# Patient Record
Sex: Female | Born: 1990 | Race: Black or African American | Hispanic: No | Marital: Single
Health system: Midwestern US, Community
[De-identification: ages and names within clinical notes are randomized; demographics above are authoritative.]

## PROBLEM LIST (undated history)

## (undated) ENCOUNTER — Emergency Department (HOSPITAL_COMMUNITY): Admission: EM | Payer: No Typology Code available for payment source | Source: Home / Self Care

## (undated) DIAGNOSIS — A599 Trichomoniasis, unspecified: Secondary | ICD-10-CM

## (undated) DIAGNOSIS — A749 Chlamydial infection, unspecified: Secondary | ICD-10-CM

## (undated) DIAGNOSIS — J302 Other seasonal allergic rhinitis: Secondary | ICD-10-CM

## (undated) HISTORY — PX: NO PAST SURGERIES: SHX2092

---
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
## 2009-11-12 ENCOUNTER — Emergency Department (HOSPITAL_COMMUNITY): Admission: EM | Admit: 2009-11-12 | Discharge: 2009-11-12 | Payer: Self-pay | Admitting: Emergency Medicine

## 2010-06-04 ENCOUNTER — Emergency Department (HOSPITAL_COMMUNITY): Admission: EM | Admit: 2010-06-04 | Discharge: 2010-06-04 | Payer: Self-pay | Admitting: Emergency Medicine

## 2010-11-06 LAB — GC/CHLAMYDIA PROBE AMP, GENITAL
Chlamydia, DNA Probe: NEGATIVE
GC Probe Amp, Genital: NEGATIVE

## 2010-11-06 LAB — DIFFERENTIAL
Basophils Absolute: 0 10*3/uL (ref 0.0–0.1)
Basophils Relative: 0 % (ref 0–1)
Eosinophils Relative: 2 % (ref 0–5)
Lymphs Abs: 2 10*3/uL (ref 0.7–4.0)
Monocytes Absolute: 0.4 10*3/uL (ref 0.1–1.0)
Monocytes Relative: 11 % (ref 3–12)
Neutro Abs: 1.7 10*3/uL (ref 1.7–7.7)

## 2010-11-06 LAB — LIPASE, BLOOD: Lipase: 21 U/L (ref 11–59)

## 2010-11-06 LAB — URINE MICROSCOPIC-ADD ON

## 2010-11-06 LAB — URINALYSIS, ROUTINE W REFLEX MICROSCOPIC
Nitrite: NEGATIVE
Protein, ur: NEGATIVE mg/dL
Urobilinogen, UA: 1 mg/dL (ref 0.0–1.0)

## 2010-11-06 LAB — CBC
HCT: 42.9 % (ref 36.0–46.0)
MCV: 87.2 fL (ref 78.0–100.0)
RBC: 4.92 MIL/uL (ref 3.87–5.11)
WBC: 4.2 10*3/uL (ref 4.0–10.5)

## 2010-11-06 LAB — COMPREHENSIVE METABOLIC PANEL
AST: 19 U/L (ref 0–37)
Albumin: 4 g/dL (ref 3.5–5.2)
Alkaline Phosphatase: 68 U/L (ref 39–117)
BUN: 5 mg/dL — ABNORMAL LOW (ref 6–23)
Creatinine, Ser: 0.57 mg/dL (ref 0.4–1.2)
Glucose, Bld: 114 mg/dL — ABNORMAL HIGH (ref 70–99)
Potassium: 3.5 mEq/L (ref 3.5–5.1)

## 2010-11-06 LAB — WET PREP, GENITAL

## 2010-11-06 LAB — POCT PREGNANCY, URINE: Preg Test, Ur: NEGATIVE

## 2010-11-06 LAB — RPR: RPR Ser Ql: NONREACTIVE

## 2010-11-17 LAB — COMPREHENSIVE METABOLIC PANEL
AST: 19 U/L (ref 0–37)
Alkaline Phosphatase: 83 U/L (ref 39–117)
BUN: 11 mg/dL (ref 6–23)
Calcium: 9.3 mg/dL (ref 8.4–10.5)
Creatinine, Ser: 0.59 mg/dL (ref 0.4–1.2)
Glucose, Bld: 82 mg/dL (ref 70–99)
Sodium: 138 mEq/L (ref 135–145)
Total Bilirubin: 0.5 mg/dL (ref 0.3–1.2)
Total Protein: 7.5 g/dL (ref 6.0–8.3)

## 2010-11-17 LAB — LACTIC ACID, PLASMA: Lactic Acid, Venous: 1.3 mmol/L (ref 0.5–2.2)

## 2010-11-17 LAB — CBC
Platelets: 249 10*3/uL (ref 150–400)
RBC: 4.79 MIL/uL (ref 3.87–5.11)
WBC: 5.2 10*3/uL (ref 4.0–10.5)

## 2010-11-17 LAB — URINALYSIS, ROUTINE W REFLEX MICROSCOPIC
Glucose, UA: NEGATIVE mg/dL
Ketones, ur: NEGATIVE mg/dL
Nitrite: NEGATIVE
Protein, ur: NEGATIVE mg/dL
Specific Gravity, Urine: 1.015 (ref 1.005–1.030)

## 2010-11-17 LAB — POCT I-STAT, CHEM 8
BUN: 12 mg/dL (ref 6–23)
Chloride: 108 mEq/L (ref 96–112)
Creatinine, Ser: 0.7 mg/dL (ref 0.4–1.2)
HCT: 45 % (ref 36.0–46.0)
Hemoglobin: 15.3 g/dL — ABNORMAL HIGH (ref 12.0–15.0)
Potassium: 4.2 mEq/L (ref 3.5–5.1)

## 2010-11-17 LAB — POCT PREGNANCY, URINE: Preg Test, Ur: NEGATIVE

## 2010-11-17 LAB — URINE MICROSCOPIC-ADD ON

## 2010-11-17 LAB — APTT: aPTT: 29 seconds (ref 24–37)

## 2010-11-17 LAB — TYPE AND SCREEN: ABO/RH(D): O POS

## 2010-11-17 LAB — PROTIME-INR: INR: 0.97 (ref 0.00–1.49)

## 2011-05-09 IMAGING — CT CT ABD-PELV W/ CM
2 of 4 series · 17 of 46 positions shown, 19 images · IV contrast (water & 80ml omni 300)
Comparison: CT abdomen pelvis of 11/12/2009

CLINICAL DATA: Right-sided abdominal pain with nausea and vomiting
for 3 days

CT ABDOMEN AND PELVIS WITH CONTRAST
TECHNIQUE: Multidetector CT imaging of the abdomen and pelvis was
performed following the standard protocol during bolus
administration of intravenous contrast.
Contrast: 80 ml Nmnipaque-T99

[Series 2: routine abdomen · axial · 0.78mm/px · z∈[-319,+21]mm · 14 of 76 slices shown, 16 images]
[im 4/76  soft-tissue]
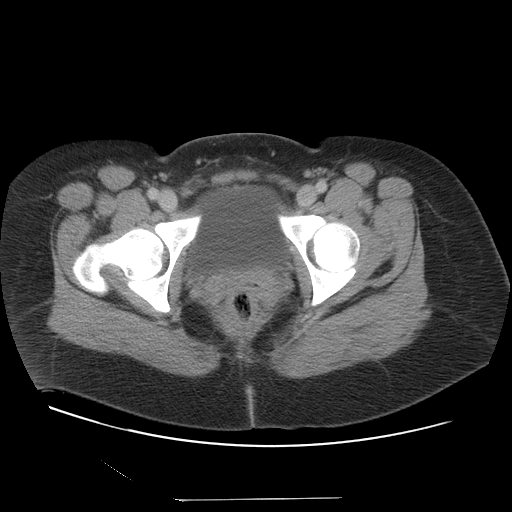
[im 4/76  bone]
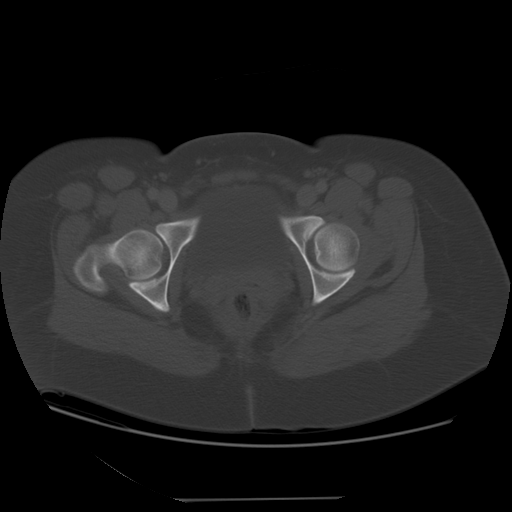
[im 11/76  soft-tissue]
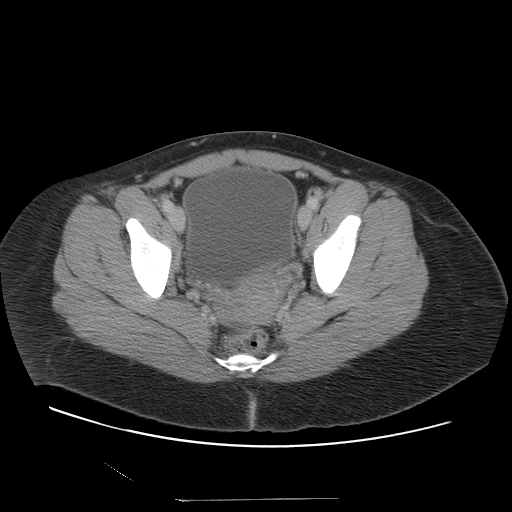
[im 14/76  soft-tissue]
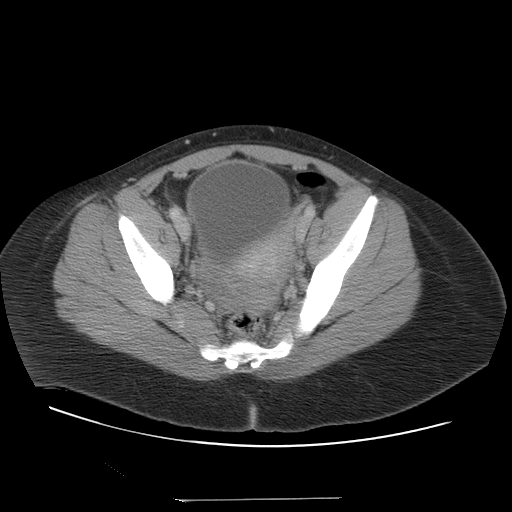
[im 21/76  soft-tissue]
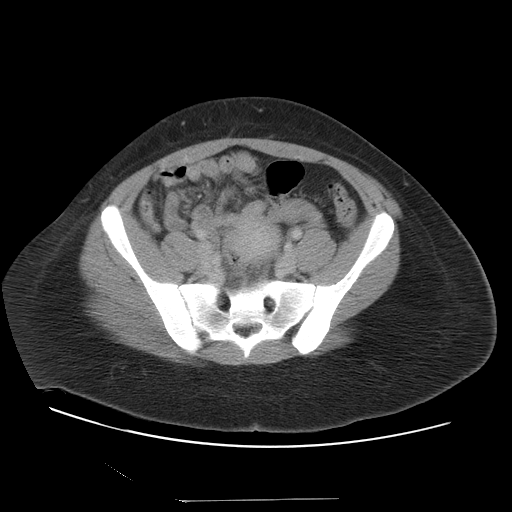
[im 24/76  soft-tissue]
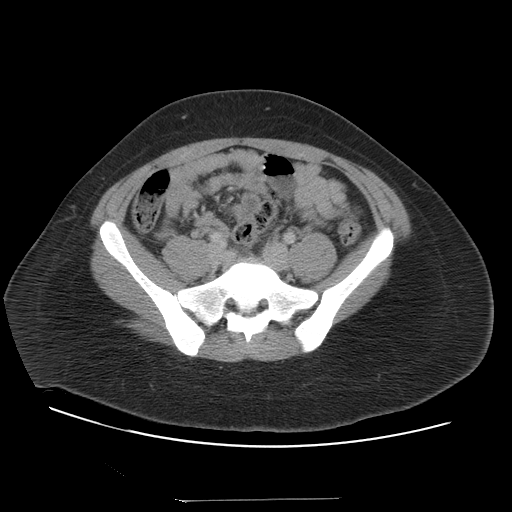
[im 31/76  soft-tissue]
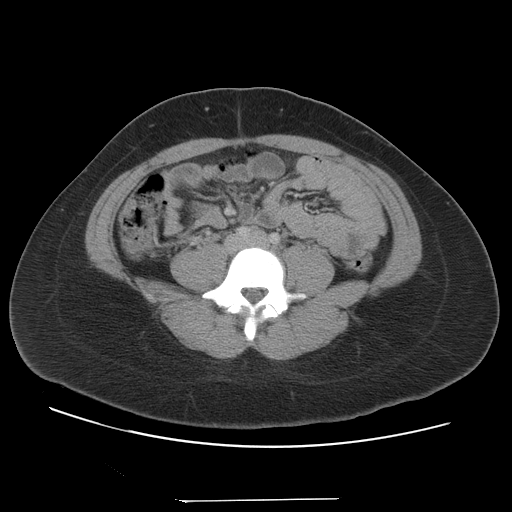
[im 35/76  soft-tissue]
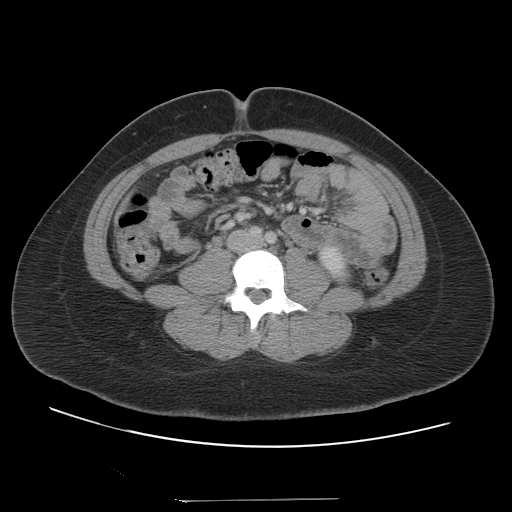
[im 41/76  soft-tissue]
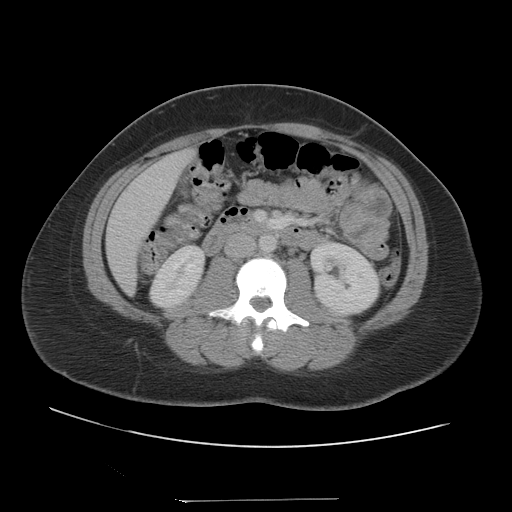
[im 45/76  soft-tissue]
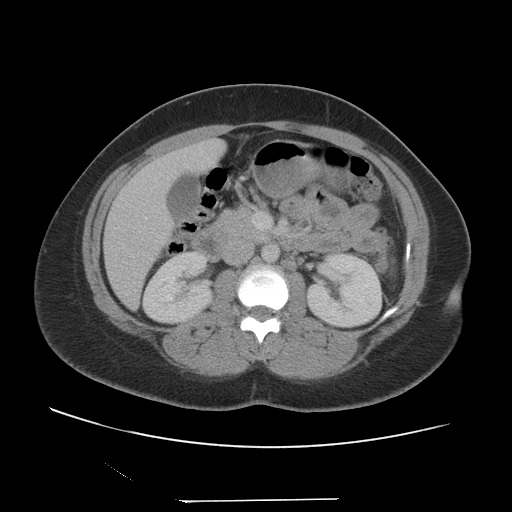
[im 45/76  bone]
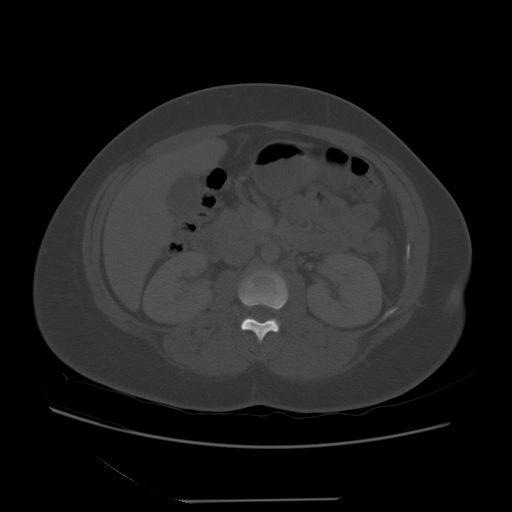
[im 52/76  soft-tissue]
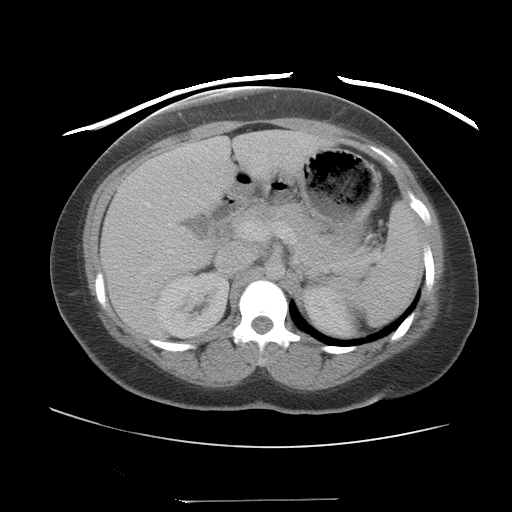
[im 55/76  soft-tissue]
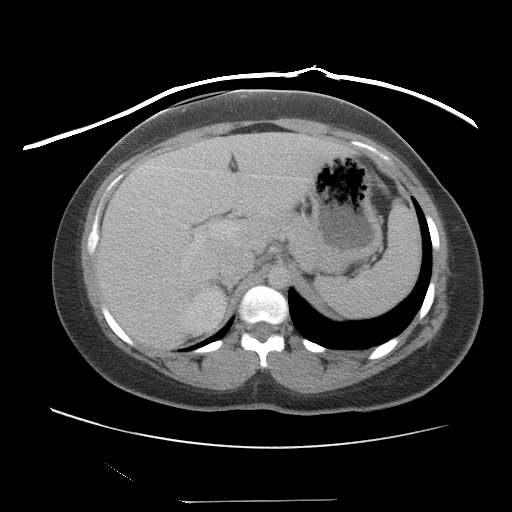
[im 62/76  soft-tissue]
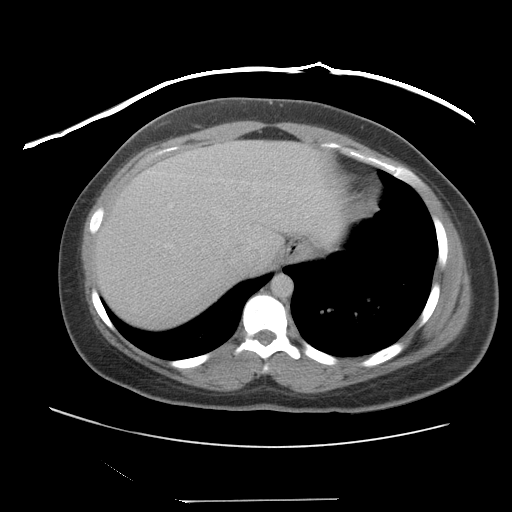
[im 65/76  soft-tissue]
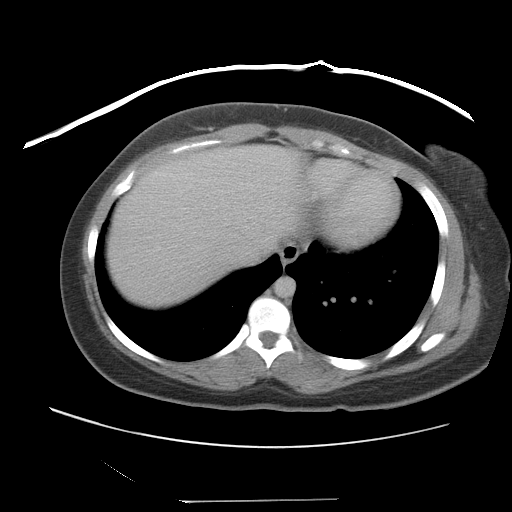
[im 72/76  soft-tissue]
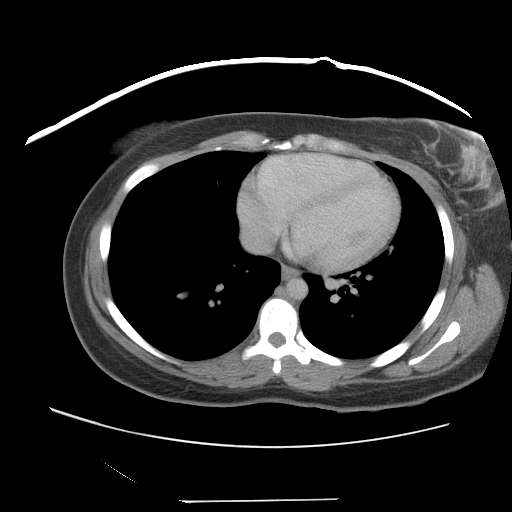

[Series 401: cor · coronal · 0.85mm/px · 3 of 78 slices shown]
[im 26/78  soft-tissue]
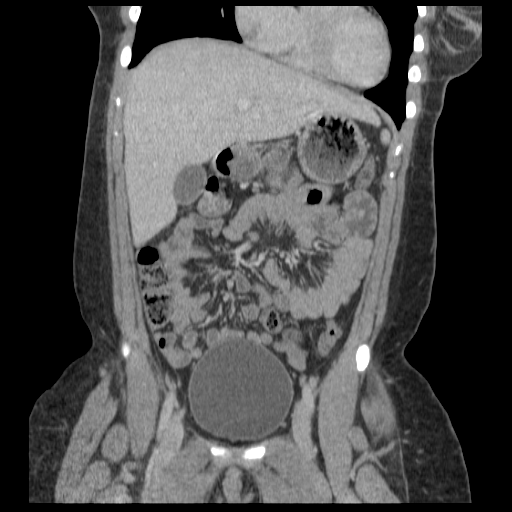
[im 35/78  soft-tissue]
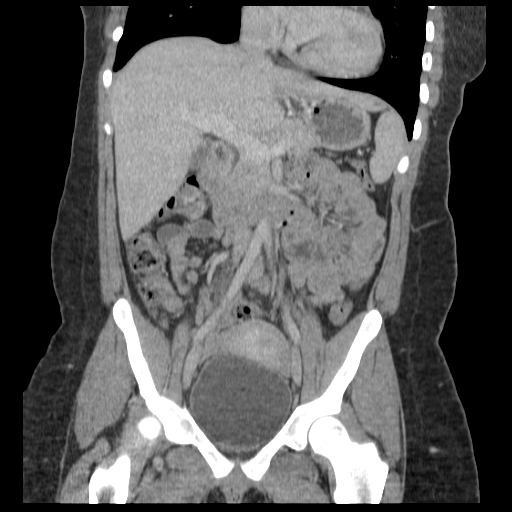
[im 43/78  soft-tissue]
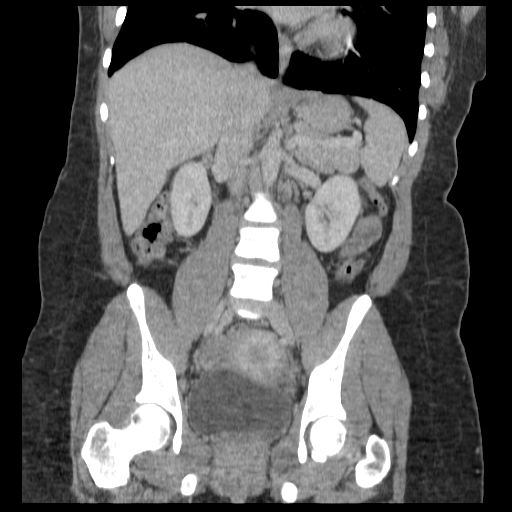

[17 of 46 positions shown; findings below may reference images not displayed]

FINDINGS: The lung bases are clear.  The liver enhances with no
focal abnormality and no ductal dilatation is seen.  No calcified
gallstones are noted.  The pancreas is normal in size and the
pancreatic duct is not dilated.  The adrenal glands and spleen are
unremarkable.  The stomach is distended with food debris and fluid
and is unremarkable.  The kidneys enhance with no calculus or mass
and no hydronephrosis is seen.  The abdominal aorta is normal in
caliber.

The appendix is visualized and the anterior right lower quadrant -
right pelvis and there is no CT evidence of appendicitis.  The
terminal ileum is unremarkable.  Uterus is normal in size and
somewhat prominent endometrium most likely is related to the
patient's menstrual cycle.  Low attenuation structures in both
adnexa are consistent with multiple follicles.  A tiny amount of
free fluid is noted.  The urinary bladder is unremarkable.
IMPRESSION: 1.  No evidence of appendicitis.
2.  Multiple ovarian follicles with a small amount of free fluid.
3.  No renal calculi.  Hydronephrosis.

## 2011-05-22 LAB — URINE CULTURE

## 2011-05-22 LAB — GC/CHLAMYDIA PROBE AMP, GENITAL
Chlamydia, DNA Probe: NEGATIVE
GC Probe Amp, Genital: POSITIVE — AB

## 2011-05-22 LAB — URINALYSIS, ROUTINE W REFLEX MICROSCOPIC
Hgb urine dipstick: NEGATIVE
Nitrite: NEGATIVE
Protein, ur: NEGATIVE
Urobilinogen, UA: 1

## 2011-05-22 LAB — WET PREP, GENITAL

## 2011-05-26 LAB — DIFFERENTIAL
Basophils Absolute: 0
Basophils Relative: 1
Eosinophils Relative: 1
Lymphocytes Relative: 14
Monocytes Absolute: 0.4

## 2011-05-26 LAB — COMPREHENSIVE METABOLIC PANEL
AST: 21
Albumin: 4.3
Alkaline Phosphatase: 77
Chloride: 103
GFR calc Af Amer: >60
Potassium: 3.3 — ABNORMAL LOW
Total Bilirubin: 0.8

## 2011-05-26 LAB — URINE MICROSCOPIC-ADD ON

## 2011-05-26 LAB — URINALYSIS, ROUTINE W REFLEX MICROSCOPIC
Bilirubin Urine: NEGATIVE
Hgb urine dipstick: NEGATIVE
Specific Gravity, Urine: 1.026
Urobilinogen, UA: 1
pH: 8.5 — ABNORMAL HIGH

## 2011-05-26 LAB — CBC
Platelets: 234
WBC: 8.5

## 2011-05-26 LAB — POCT PREGNANCY, URINE: Preg Test, Ur: NEGATIVE

## 2011-06-09 LAB — DIFFERENTIAL
Eosinophils Absolute: 0
Eosinophils Relative: 0
Lymphs Abs: 1.4
Monocytes Absolute: 0.3
Monocytes Relative: 4

## 2011-06-09 LAB — URINE MICROSCOPIC-ADD ON

## 2011-06-09 LAB — CBC
HCT: 37.8
Hemoglobin: 12.9
MCV: 80.7 — ABNORMAL LOW
Platelets: 297
RBC: 4.69
WBC: 6.1

## 2011-06-09 LAB — URINALYSIS, ROUTINE W REFLEX MICROSCOPIC
Bilirubin Urine: NEGATIVE
Hgb urine dipstick: NEGATIVE
Specific Gravity, Urine: 1.03
Urobilinogen, UA: 1

## 2011-06-09 LAB — BASIC METABOLIC PANEL
BUN: 5 — ABNORMAL LOW
Chloride: 104
Potassium: 3.6
Sodium: 139

## 2011-06-09 LAB — GC/CHLAMYDIA PROBE AMP, GENITAL
Chlamydia, DNA Probe: POSITIVE — AB
GC Probe Amp, Genital: NEGATIVE

## 2011-06-09 LAB — WET PREP, GENITAL

## 2012-03-15 ENCOUNTER — Encounter (HOSPITAL_COMMUNITY): Payer: Self-pay | Admitting: Emergency Medicine

## 2012-03-15 ENCOUNTER — Emergency Department (HOSPITAL_COMMUNITY)
Admission: EM | Admit: 2012-03-15 | Discharge: 2012-03-15 | Discharge status: Against Medical Advice | Payer: Self-pay | Attending: Emergency Medicine | Admitting: Emergency Medicine

## 2012-03-15 DIAGNOSIS — O26859 Spotting complicating pregnancy, unspecified trimester: Secondary | ICD-10-CM | POA: Insufficient documentation

## 2012-03-15 HISTORY — DX: Other seasonal allergic rhinitis: J30.2

## 2012-03-15 LAB — URINALYSIS, ROUTINE W REFLEX MICROSCOPIC
Nitrite: POSITIVE — AB
Protein, ur: 100 mg/dL — AB
Specific Gravity, Urine: 1.025 (ref 1.005–1.030)
Urobilinogen, UA: 1 mg/dL (ref 0.0–1.0)

## 2012-03-15 LAB — URINE MICROSCOPIC-ADD ON

## 2012-03-15 LAB — POCT PREGNANCY, URINE: Preg Test, Ur: NEGATIVE

## 2012-03-15 NOTE — ED Notes (Addendum)
Took 2 preg test 3.5 weeks ago, both came back positive; reports off and on has been spotting since then, but today and yesterday has been having bright red blood; has gone through 2 pads since 10am; reports noticed clots; reports sharp pains but states its in stomach unable to tell or point to location of pain; LMP July 1st

## 2012-03-15 NOTE — ED Notes (Signed)
Pt called 3x no answer  

## 2013-08-07 ENCOUNTER — Encounter (HOSPITAL_COMMUNITY): Payer: Self-pay | Admitting: Emergency Medicine

## 2013-08-07 ENCOUNTER — Emergency Department (HOSPITAL_COMMUNITY)
Admission: EM | Admit: 2013-08-07 | Discharge: 2013-08-08 | Disposition: A | Discharge status: Home / Self Care | Payer: Self-pay | Attending: Emergency Medicine | Admitting: Emergency Medicine

## 2013-08-07 DIAGNOSIS — Z8709 Personal history of other diseases of the respiratory system: Secondary | ICD-10-CM | POA: Insufficient documentation

## 2013-08-07 DIAGNOSIS — T148XXA Other injury of unspecified body region, initial encounter: Secondary | ICD-10-CM

## 2013-08-07 DIAGNOSIS — S40029A Contusion of unspecified upper arm, initial encounter: Secondary | ICD-10-CM | POA: Insufficient documentation

## 2013-08-07 DIAGNOSIS — Z880 Allergy status to penicillin: Secondary | ICD-10-CM | POA: Insufficient documentation

## 2013-08-07 DIAGNOSIS — Y92410 Unspecified street and highway as the place of occurrence of the external cause: Secondary | ICD-10-CM | POA: Insufficient documentation

## 2013-08-07 DIAGNOSIS — Z87891 Personal history of nicotine dependence: Secondary | ICD-10-CM | POA: Insufficient documentation

## 2013-08-07 DIAGNOSIS — Y9389 Activity, other specified: Secondary | ICD-10-CM | POA: Insufficient documentation
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
## 2013-08-08 MED ORDER — IBUPROFEN 200 MG PO TABS
600.0000 mg | ORAL_TABLET | Freq: Once | ORAL | Status: AC
  Administered 2013-08-08: 600 mg via ORAL
  Filled 2013-08-08: qty 3

## 2013-08-08 MED ORDER — NAPROXEN 375 MG PO TABS: 375.0000 mg | ORAL_TABLET | Freq: Two times a day (BID) | ORAL | Status: DC

## 2013-08-08 NOTE — ED Provider Notes (Signed)
Medical screening examination/treatment/procedure(s) were performed by non-physician practitioner and as supervising physician I was immediately available for consultation/collaboration.  EKG Interpretation   None         Gwyneth Sprout, MD 08/08/13 [phone]

## 2016-01-06 ENCOUNTER — Emergency Department: Admit: 2016-01-06 | Payer: Self-pay

## 2016-01-06 ENCOUNTER — Inpatient Hospital Stay
Admit: 2016-01-06 | Discharge: 2016-01-06 | Disposition: A | Discharge status: Home / Self Care | Payer: Self-pay | Attending: Emergency Medicine

## 2016-01-06 DIAGNOSIS — N3001 Acute cystitis with hematuria: Secondary | ICD-10-CM

## 2016-01-06 LAB — URINALYSIS W/ RFLX MICROSCOPIC
Glucose: NEGATIVE mg/dL
Ketone: NEGATIVE mg/dL
Nitrites: POSITIVE — AB
Protein: 30 mg/dL — AB
Specific gravity: 1.026 (ref 1.003–1.030)
Urobilinogen: 1 EU/dL (ref 0.2–1.0)
pH (UA): 6.5 (ref 5.0–8.0)

## 2016-01-06 LAB — CBC WITH AUTOMATED DIFF
ABS. BASOPHILS: 0 10*3/uL (ref 0.0–0.1)
ABS. EOSINOPHILS: 0 10*3/uL (ref 0.0–0.4)
ABS. LYMPHOCYTES: 1.7 10*3/uL (ref 0.8–3.5)
ABS. MONOCYTES: 0.5 10*3/uL (ref 0.0–1.0)
ABS. NEUTROPHILS: 3.8 10*3/uL (ref 1.8–8.0)
BASOPHILS: 0 % (ref 0–1)
EOSINOPHILS: 1 % (ref 0–7)
HCT: 39.3 % (ref 35.0–47.0)
HGB: 13.4 g/dL (ref 11.5–16.0)
LYMPHOCYTES: 28 % (ref 12–49)
MCH: 29.7 PG (ref 26.0–34.0)
MCHC: 34.1 g/dL (ref 30.0–36.5)
MCV: 87.1 FL (ref 80.0–99.0)
MONOCYTES: 9 % (ref 5–13)
NEUTROPHILS: 62 % (ref 32–75)
PLATELET: 291 10*3/uL (ref 150–400)
RBC: 4.51 M/uL (ref 3.80–5.20)
RDW: 14.6 % — ABNORMAL HIGH (ref 11.5–14.5)
WBC: 6.1 10*3/uL (ref 3.6–11.0)

## 2016-01-06 LAB — METABOLIC PANEL, COMPREHENSIVE
A-G Ratio: 0.9 — ABNORMAL LOW (ref 1.1–2.2)
ALT (SGPT): 26 U/L (ref 12–78)
AST (SGOT): 15 U/L (ref 15–37)
Albumin: 3.8 g/dL (ref 3.5–5.0)
Alk. phosphatase: 75 U/L (ref 45–117)
Anion gap: 9 mmol/L (ref 5–15)
BUN/Creatinine ratio: 18 (ref 12–20)
BUN: 12 MG/DL (ref 6–20)
Bilirubin, total: 0.5 MG/DL (ref 0.2–1.0)
CO2: 24 mmol/L (ref 21–32)
Calcium: 8.9 MG/DL (ref 8.5–10.1)
Chloride: 107 mmol/L (ref 97–108)
Creatinine: 0.68 MG/DL (ref 0.55–1.02)
GFR est AA: >60 mL/min/{1.73_m2} (ref >60)
GFR est non-AA: >60 mL/min/{1.73_m2} (ref >60)
Globulin: 4.3 g/dL — ABNORMAL HIGH (ref 2.0–4.0)
Glucose: 141 mg/dL — ABNORMAL HIGH (ref 65–100)
Potassium: 3.7 mmol/L (ref 3.5–5.1)
Protein, total: 8.1 g/dL (ref 6.4–8.2)
Sodium: 140 mmol/L (ref 136–145)

## 2016-01-06 LAB — LIPASE: Lipase: 89 U/L (ref 73–393)

## 2016-01-06 LAB — WET PREP: Wet prep: NONE SEEN

## 2016-01-06 LAB — HCG QL SERUM: HCG, Ql.: NEGATIVE

## 2016-01-06 LAB — KOH, OTHER SOURCES: KOH: NONE SEEN

## 2016-01-06 LAB — HCG URINE, QL. - POC: Pregnancy test,urine (POC): NEGATIVE

## 2016-01-06 LAB — BILIRUBIN, CONFIRM: Bilirubin UA, confirm: NEGATIVE

## 2016-01-06 MED ORDER — MORPHINE 2 MG/ML INJECTION
2 mg/mL | INTRAMUSCULAR | Status: AC
Start: 2016-01-06 — End: 2016-01-06
  Administered 2016-01-06: 17:00:00 via INTRAVENOUS

## 2016-01-06 MED ORDER — ONDANSETRON (PF) 4 MG/2 ML INJECTION
4 mg/2 mL | INTRAMUSCULAR | Status: AC
Start: 2016-01-06 — End: 2016-01-06
  Administered 2016-01-06: 17:00:00 via INTRAVENOUS

## 2016-01-06 MED ORDER — ONDANSETRON 4 MG TAB, RAPID DISSOLVE
4 mg | ORAL_TABLET | Freq: Three times a day (TID) | ORAL | 0 refills | Status: AC | PRN
Start: 2016-01-06 — End: ?

## 2016-01-06 MED ORDER — SODIUM CHLORIDE 0.9% BOLUS IV
0.9 % | INTRAVENOUS | Status: AC
Start: 2016-01-06 — End: 2016-01-06
  Administered 2016-01-06: 17:00:00 via INTRAVENOUS

## 2016-01-06 MED ORDER — METRONIDAZOLE 500 MG TAB
500 mg | ORAL_TABLET | Freq: Two times a day (BID) | ORAL | 0 refills | Status: AC
Start: 2016-01-06 — End: 2016-01-13

## 2016-01-06 MED ORDER — HYDROCODONE-ACETAMINOPHEN 5 MG-325 MG TAB
5-325 mg | ORAL_TABLET | ORAL | 0 refills | Status: AC | PRN
Start: 2016-01-06 — End: ?

## 2016-01-06 MED ORDER — NITROFURANTOIN (25% MACROCRYSTAL FORM) 100 MG CAP
100 mg | ORAL_CAPSULE | Freq: Two times a day (BID) | ORAL | 0 refills | Status: AC
Start: 2016-01-06 — End: 2016-01-09

## 2016-01-06 MED ORDER — KETOROLAC TROMETHAMINE 30 MG/ML INJECTION
30 mg/mL (1 mL) | INTRAMUSCULAR | Status: AC
Start: 2016-01-06 — End: 2016-01-06
  Administered 2016-01-06: 18:00:00 via INTRAVENOUS

## 2016-01-06 MED ORDER — IBUPROFEN 600 MG TAB
600 mg | ORAL_TABLET | Freq: Four times a day (QID) | ORAL | 0 refills | Status: AC | PRN
Start: 2016-01-06 — End: ?

## 2016-01-06 MED FILL — MORPHINE 2 MG/ML INJECTION: 2 mg/mL | INTRAMUSCULAR | Qty: 2

## 2016-01-06 MED FILL — ONDANSETRON (PF) 4 MG/2 ML INJECTION: 4 mg/2 mL | INTRAMUSCULAR | Qty: 2

## 2016-01-06 MED FILL — SODIUM CHLORIDE 0.9 % IV: INTRAVENOUS | Qty: 1000

## 2016-01-06 MED FILL — KETOROLAC TROMETHAMINE 30 MG/ML INJECTION: 30 mg/mL (1 mL) | INTRAMUSCULAR | Qty: 1

## 2016-01-06 NOTE — ED Provider Notes (Signed)
HPI Comments: Carol Parsons is a 25 y.o. female with no pertinent PMHx who presents ambulatory to the ED c/o vaginal bleeding and 10/10, acute, lower quadrant abdominal pain since this morning. She endorses similar symptoms of chills, nausea and vomiting with yellow bile. Pt explains that she started her menstral cycle today and is having severe bleeding and abdominal cramping. Pt notes that her abdominal pain and heavy bleeding is not unchanged from her baseline, but she "can't take the pain anymore". She notes that her cycle is regular, and arrived on time this month. Pt states that there is no possibility of pregnancy. Pt specifically denies back pain, fever, diarrhea or urinary difficulties. Pt also denies a Hx of abdominal surgeries.    Social hx: + Tobacco use, + EtOH use, - Illicit drug use    PCP: None    There are no other complaints, changes or physical findings at this time.    The history is provided by the patient. No language interpreter was used.        History reviewed. No pertinent past medical history.    History reviewed. No pertinent surgical history.      History reviewed. No pertinent family history.    Social History     Social History   ??? Marital status: SINGLE     Spouse name: N/A   ??? Number of children: N/A   ??? Years of education: N/A     Occupational History   ??? Not on file.     Social History Main Topics   ??? Smoking status: Current Every Day Smoker   ??? Smokeless tobacco: Not on file   ??? Alcohol use Yes   ??? Drug use: Not on file   ??? Sexual activity: Not Currently     Other Topics Concern   ??? Not on file     Social History Narrative   ??? No narrative on file         ALLERGIES: Pcn [penicillins]    Review of Systems   Constitutional: Positive for chills. Negative for fever.   HENT: Negative for congestion.    Eyes: Negative.    Respiratory: Negative for apnea.    Gastrointestinal: Positive for abdominal pain (+ lower quadrant), nausea and vomiting. Negative for diarrhea.    Endocrine: Negative for heat intolerance.   Genitourinary: Positive for vaginal bleeding.   Musculoskeletal: Negative for back pain.   Skin: Negative for rash.   Allergic/Immunologic: Negative for immunocompromised state.   Neurological: Negative for dizziness.   Hematological: Does not bruise/bleed easily.   Psychiatric/Behavioral: Negative.    All other systems reviewed and are negative.      Patient Vitals for the past 12 hrs:   Temp Pulse Resp BP SpO2   01/06/16 1430 - - - 127/62 98 %   01/06/16 1415 - - - 116/86 98 %   01/06/16 1410 - (!) 56 18 101/70 99 %   01/06/16 1400 - - - 101/70 100 %   01/06/16 1345 - - - 111/80 99 %   01/06/16 1249 97.8 ??F (36.6 ??C) 69 20 (!) 119/106 100 %            Physical Exam   Constitutional: She is oriented to person, place, and time. She appears well-developed and well-nourished.   In severe distress   HENT:   Head: Normocephalic and atraumatic.   Eyes: EOM are normal.   Neck: Normal range of motion. Neck supple.   Cardiovascular: Normal rate,  regular rhythm and normal heart sounds.    Pulmonary/Chest: Effort normal and breath sounds normal. No respiratory distress.   Abdominal: Soft. Bowel sounds are normal. She exhibits no mass. There is no tenderness.   Mild distension in the abdomen  Tenderness present in all quadrants, but most tender in the lower quadrant   Genitourinary:   Genitourinary Comments:      Musculoskeletal: Normal range of motion. She exhibits no edema.   Neurological: She is alert and oriented to person, place, and time. Coordination normal.   Skin: Skin is warm and dry.   Psychiatric: Her mood appears anxious.   Nursing note and vitals reviewed.  GU Exam: Mild bleeding, but no cervical motion tenderness nor adnexal tenderness    MDM  Number of Diagnoses or Management Options  Abnormal vaginal bleeding:   Acute cystitis with hematuria:   BV (bacterial vaginosis):   Diagnosis management comments: DDx: severe menstrual cramps, threatened  abortion, ovarian cyst, torsion, PID, vaginitis, UTI, hormone abnormality, fibroids, anemia       Amount and/or Complexity of Data Reviewed  Clinical lab tests: ordered and reviewed  Tests in the radiology section of CPT??: ordered and reviewed    Patient Progress  Patient progress: stable    ED Course       Procedures    Procedure Note - Pelvic Exam:    1:52 PM  Performed by: Seward Speck, MD  Pelvic exam was performed using bimanual and speculum. Further findings noted in physical exam.   The procedure took 1-15 minutes, and pt tolerated well.   Written by Estevan Ryder, ED Scribe, as dictated by Seward Speck, MD.    PROGRESS NOTE:  1:52 PM  Pt's pain has decreased to a 6/10.  Written by Estevan Ryder, ED Scribe, as dictated by Seward Speck, MD.     PROGRESS NOTE:  2:48 PM  Pt's pain has significantly decreased and she is now resting comfortably.  Written by Estevan Ryder, ED Scribe, as dictated by Seward Speck, MD.    SIGN OUT:  3:04 PM  Patient's presentation, labs/imaging and plan of care was reviewed with Rada Hay, DO as part of sign out. Pt is currently waiting for lab results to come back and the patient's status will be re-assessed after her results return.     Rada Hay, DO's assistance in completion of this plan is greatly appreciated but it should be noted that I will be the provider of record for this patient.    Seward Speck, MD    LABORATORY TESTS:  Recent Results (from the past 12 hour(s))   HCG URINE, QL. - POC    Collection Time: 01/06/16  1:12 PM   Result Value Ref Range    Pregnancy test,urine (POC) NEGATIVE  NEG     CBC WITH AUTOMATED DIFF    Collection Time: 01/06/16  1:15 PM   Result Value Ref Range    WBC 6.1 3.6 - 11.0 K/uL    RBC 4.51 3.80 - 5.20 M/uL    HGB 13.4 11.5 - 16.0 g/dL    HCT 39.3 35.0 - 47.0 %    MCV 87.1 80.0 - 99.0 FL    MCH 29.7 26.0 - 34.0 PG    MCHC 34.1 30.0 - 36.5 g/dL    RDW 14.6 (H) 11.5 - 14.5 %    PLATELET 291 150 - 400 K/uL     NEUTROPHILS 62 32 - 75 %  LYMPHOCYTES 28 12 - 49 %    MONOCYTES 9 5 - 13 %    EOSINOPHILS 1 0 - 7 %    BASOPHILS 0 0 - 1 %    ABS. NEUTROPHILS 3.8 1.8 - 8.0 K/UL    ABS. LYMPHOCYTES 1.7 0.8 - 3.5 K/UL    ABS. MONOCYTES 0.5 0.0 - 1.0 K/UL    ABS. EOSINOPHILS 0.0 0.0 - 0.4 K/UL    ABS. BASOPHILS 0.0 0.0 - 0.1 K/UL   METABOLIC PANEL, COMPREHENSIVE    Collection Time: 01/06/16  1:15 PM   Result Value Ref Range    Sodium 140 136 - 145 mmol/L    Potassium 3.7 3.5 - 5.1 mmol/L    Chloride 107 97 - 108 mmol/L    CO2 24 21 - 32 mmol/L    Anion gap 9 5 - 15 mmol/L    Glucose 141 (H) 65 - 100 mg/dL    BUN 12 6 - 20 MG/DL    Creatinine 0.68 0.55 - 1.02 MG/DL    BUN/Creatinine ratio 18 12 - 20      GFR est AA >60 >60 ml/min/1.4m    GFR est non-AA >60 >60 ml/min/1.774m   Calcium 8.9 8.5 - 10.1 MG/DL    Bilirubin, total 0.5 0.2 - 1.0 MG/DL    ALT (SGPT) 26 12 - 78 U/L    AST (SGOT) 15 15 - 37 U/L    Alk. phosphatase 75 45 - 117 U/L    Protein, total 8.1 6.4 - 8.2 g/dL    Albumin 3.8 3.5 - 5.0 g/dL    Globulin 4.3 (H) 2.0 - 4.0 g/dL    A-G Ratio 0.9 (L) 1.1 - 2.2     URINALYSIS W/ RFLX MICROSCOPIC    Collection Time: 01/06/16  1:15 PM   Result Value Ref Range    Color DARK YELLOW      Appearance CLOUDY (A) CLEAR      Specific gravity 1.026 1.003 - 1.030      pH (UA) 6.5 5.0 - 8.0      Protein 30 (A) NEG mg/dL    Glucose NEGATIVE  NEG mg/dL    Ketone NEGATIVE  NEG mg/dL    Blood LARGE (A) NEG      Urobilinogen 1.0 0.2 - 1.0 EU/dL    Nitrites POSITIVE (A) NEG      Leukocyte Esterase MODERATE (A) NEG      WBC 5-10 0 - 4 /hpf    RBC 50-100 0 - 5 /hpf    Epithelial cells FEW FEW /lpf    Bacteria 1+ (A) NEG /hpf    Mucus TRACE (A) NEG /lpf   LIPASE    Collection Time: 01/06/16  1:15 PM   Result Value Ref Range    Lipase 89 73 - 393 U/L   HCG QL SERUM    Collection Time: 01/06/16  1:15 PM   Result Value Ref Range    HCG, Ql. NEGATIVE  NEG     WET PREP    Collection Time: 01/06/16  1:15 PM   Result Value Ref Range     Clue cells CLUE CELLS PRESENT      Wet prep NO TRICHOMONAS SEEN     KOH, OTHER SOURCES    Collection Time: 01/06/16  1:15 PM   Result Value Ref Range    Special Requests: NO SPECIAL REQUESTS      KOH NO YEAST SEEN     BILIRUBIN, CONFIRM  Collection Time: 01/06/16  1:15 PM   Result Value Ref Range    Bilirubin UA, confirm NEGATIVE  NEG         IMAGING RESULTS:  [place] PELV NON OBS   Final Result   HISTORY: Heavy menstrual bleeding, severe pain  ??  Technique: Routine transpelvic ultrasound images were obtained as well as  transvaginal images for better definition of the uterus and adnexa.  ??  FINDINGS: The transpelvic images demonstrate a distended urinary bladder without  evidence of filling defect. The uterus measures 7.8 x 3.8 x 5.7 cm. Myometrial  echogenicity is normal. The endometrial stripe measures 4 mm by transpelvic  technique. The right ovary measures 4.1 cm. The left ovary measures 3.8 cm.  Both ovaries demonstrate normal echogenicity and no evidence of mass.  ??  Transvaginal images demonstrate a normal endometrial stripe measuring 4 mm in  thickness. No myometrial mass is seen. The cervix has a normal appearance. The  right ovary measures 3.2 cm with intact vascularity. The left ovary measures  3.3 cm with intact vascularity. No adnexal mass or pathological cyst is seen.  There is trace physiologic pelvic free fluid.   ??  IMPRESSION  IMPRESSION:  Normal pelvic ultrasound.  Signed by    ?? Signed Date/Time    Phone Pager   ?? Paralee Cancel 01/06/2016 15:26 [phone]       US TRANSVAGINAL   Final Result   HISTORY: Heavy menstrual bleeding, severe pain  ??  Technique: Routine transpelvic ultrasound images were obtained as well as  transvaginal images for better definition of the uterus and adnexa.  ??  FINDINGS: The transpelvic images demonstrate a distended urinary bladder without  evidence of filling defect. The uterus measures 7.8 x 3.8 x 5.7 cm. Myometrial   echogenicity is normal. The endometrial stripe measures 4 mm by transpelvic  technique. The right ovary measures 4.1 cm. The left ovary measures 3.8 cm.  Both ovaries demonstrate normal echogenicity and no evidence of mass.  ??  Transvaginal images demonstrate a normal endometrial stripe measuring 4 mm in  thickness. No myometrial mass is seen. The cervix has a normal appearance. The  right ovary measures 3.2 cm with intact vascularity. The left ovary measures  3.3 cm with intact vascularity. No adnexal mass or pathological cyst is seen.  There is trace physiologic pelvic free fluid.   ??  IMPRESSION  IMPRESSION:  Normal pelvic ultrasound.  Signed by    ?? Signed Date/Time    Phone Pager   ?? Paralee Cancel 01/06/2016 15:26 [phone]           MEDICATIONS GIVEN:  Medications   sodium chloride 0.9 % bolus infusion 1,000 mL (0 mL IntraVENous IV Completed 01/06/16 1440)   morphine injection 4 mg (4 mg IntraVENous Given 01/06/16 1309)   ondansetron (ZOFRAN) injection 4 mg (4 mg IntraVENous Given 01/06/16 1309)   ketorolac (TORADOL) injection 30 mg (30 mg IntraVENous Given 01/06/16 1408)       IMPRESSION:  1. Abnormal vaginal bleeding    2. Acute cystitis with hematuria    3. BV (bacterial vaginosis)        PLAN:  1.   Current Discharge Medication List      START taking these medications    Details   HYDROcodone-acetaminophen (NORCO) 5-325 mg per tablet Take 1 Tab by mouth every four (4) hours as needed for Pain. Max Daily Amount: 6 Tabs.  Qty: 20 Tab, Refills: 0  ibuprofen (MOTRIN) 600 mg tablet Take 1 Tab by mouth every six (6) hours as needed for Pain.  Qty: 20 Tab, Refills: 0      ondansetron (ZOFRAN ODT) 4 mg disintegrating tablet Take 1 Tab by mouth every eight (8) hours as needed for Nausea.  Qty: 10 Tab, Refills: 0      metroNIDAZOLE (FLAGYL) 500 mg tablet Take 1 Tab by mouth two (2) times a day for 7 days.  Qty: 14 Tab, Refills: 0      nitrofurantoin, macrocrystal-monohydrate, (MACROBID) 100 mg capsule Take 1  Cap by mouth two (2) times a day for 3 days.  Qty: 6 Cap, Refills: 0           2.   Follow-up Information     Follow up With Details Comments [place], MD Call in 2 days  [place]  [place] [address]  [phone]      [facility]  If symptoms worsen [address]  [place]  [phone]        Return to ED if worse     Discharge Note:  4:00 PM  The patient is ready for discharge. The patient's signs, symptoms, diagnosis, and discharge instruction have been discussed and the patient has conveyed their understanding. The patient is to follow up as recommended or return to the ER should their symptoms worsen. Plan has been discussed and the patient is in agreement.  Written by Stormy Card, ED Scribe, as dictated by Rada Hay, DO.     Attestation:  This note is prepared by Stormy Card, acting as Scribe for Rada Hay, [place], DO: The scribe's documentation has been prepared under my direction and personally reviewed by me in its entirety. I confirm that the note above accurately reflects all work, treatment, procedures, and medical decision making performed by me.

## 2016-01-06 NOTE — ED Notes (Signed)
Patient identified and read over and explained discharge instructions with time for questions by attending MD/PA.  Patient has verbalized understanding of discharge instructions.

## 2016-01-07 LAB — CHLAMYDIA/GC PCR
Chlamydia amplified: NEGATIVE
N. gonorrhea, amplified: NEGATIVE

## 2017-06-23 ENCOUNTER — Encounter (HOSPITAL_COMMUNITY): Payer: Self-pay

## 2017-06-23 ENCOUNTER — Emergency Department (HOSPITAL_COMMUNITY)
Admission: EM | Admit: 2017-06-23 | Discharge: 2017-06-23 | Disposition: A | Discharge status: Home / Self Care | Payer: Self-pay | Attending: Emergency Medicine | Admitting: Emergency Medicine

## 2017-06-23 DIAGNOSIS — Z202 Contact with and (suspected) exposure to infections with a predominantly sexual mode of transmission: Secondary | ICD-10-CM | POA: Insufficient documentation

## 2017-06-23 DIAGNOSIS — R1030 Lower abdominal pain, unspecified: Secondary | ICD-10-CM | POA: Insufficient documentation

## 2017-06-23 DIAGNOSIS — Z79899 Other long term (current) drug therapy: Secondary | ICD-10-CM | POA: Insufficient documentation

## 2017-06-23 DIAGNOSIS — E860 Dehydration: Secondary | ICD-10-CM

## 2017-06-23 DIAGNOSIS — F12988 Cannabis use, unspecified with other cannabis-induced disorder: Secondary | ICD-10-CM | POA: Insufficient documentation

## 2017-06-23 DIAGNOSIS — R112 Nausea with vomiting, unspecified: Secondary | ICD-10-CM

## 2017-06-23 DIAGNOSIS — N898 Other specified noninflammatory disorders of vagina: Secondary | ICD-10-CM | POA: Insufficient documentation

## 2017-06-23 DIAGNOSIS — Z79891 Long term (current) use of opiate analgesic: Secondary | ICD-10-CM | POA: Insufficient documentation

## 2017-06-23 DIAGNOSIS — E876 Hypokalemia: Secondary | ICD-10-CM | POA: Insufficient documentation

## 2017-06-23 LAB — BASIC METABOLIC PANEL
Anion gap: 9 (ref 5–15)
BUN: 9 mg/dL (ref 6–20)
CALCIUM: 9.4 mg/dL (ref 8.9–10.3)
CO2: 25 mmol/L (ref 22–32)
CREATININE: 0.6 mg/dL (ref 0.44–1.00)
Chloride: 107 mmol/L (ref 101–111)
GFR calc non Af Amer: >60 mL/min (ref >60)
Glucose, Bld: 99 mg/dL (ref 65–99)
Potassium: 3.4 mmol/L — ABNORMAL LOW (ref 3.5–5.1)
SODIUM: 141 mmol/L (ref 135–145)

## 2017-06-23 LAB — WET PREP, GENITAL
CLUE CELLS WET PREP: NONE SEEN
SPERM: NONE SEEN
TRICH WET PREP: NONE SEEN
YEAST WET PREP: NONE SEEN

## 2017-06-23 LAB — URINALYSIS, ROUTINE W REFLEX MICROSCOPIC
Glucose, UA: NEGATIVE mg/dL
Hgb urine dipstick: NEGATIVE
Ketones, ur: 80 mg/dL — AB
LEUKOCYTES UA: NEGATIVE
Nitrite: NEGATIVE
Protein, ur: 30 mg/dL — AB
SPECIFIC GRAVITY, URINE: 1.039 — AB (ref 1.005–1.030)
pH: 6 (ref 5.0–8.0)

## 2017-06-23 LAB — CBC
HCT: 41.1 % (ref 36.0–46.0)
HEMOGLOBIN: 14.1 g/dL (ref 12.0–15.0)
MCH: 30.9 pg (ref 26.0–34.0)
MCHC: 34.3 g/dL (ref 30.0–36.0)
MCV: 90.1 fL (ref 78.0–100.0)
Platelets: 275 10*3/uL (ref 150–400)
RBC: 4.56 MIL/uL (ref 3.87–5.11)
RDW: 14.4 % (ref 11.5–15.5)
WBC: 7.7 10*3/uL (ref 4.0–10.5)

## 2017-06-23 LAB — POC URINE PREG, ED: Preg Test, Ur: NEGATIVE

## 2017-06-23 MED ORDER — CEFTRIAXONE SODIUM 250 MG IJ SOLR
250.0000 mg | Freq: Once | INTRAMUSCULAR | Status: AC
  Administered 2017-06-23: 250 mg via INTRAMUSCULAR
  Filled 2017-06-23: qty 250

## 2017-06-23 MED ORDER — SODIUM CHLORIDE 0.9 % IV BOLUS (SEPSIS)
1000.0000 mL | Freq: Once | INTRAVENOUS | Status: AC
  Administered 2017-06-23: 1000 mL via INTRAVENOUS

## 2017-06-23 MED ORDER — CAPSICUM OLEORESIN 0.025 % EX CREA: TOPICAL_CREAM | Freq: Three times a day (TID) | CUTANEOUS | Status: DC

## 2017-06-23 MED ORDER — FAMOTIDINE IN NACL 20-0.9 MG/50ML-% IV SOLN
20.0000 mg | Freq: Once | INTRAVENOUS | Status: AC
Start: 2017-06-23 — End: 2017-06-23
  Administered 2017-06-23: 20 mg via INTRAVENOUS
  Filled 2017-06-23: qty 50

## 2017-06-23 MED ORDER — ONDANSETRON HCL 4 MG/2ML IJ SOLN
4.0000 mg | Freq: Once | INTRAMUSCULAR | Status: AC
  Administered 2017-06-23: 4 mg via INTRAVENOUS
  Filled 2017-06-23: qty 2

## 2017-06-23 MED ORDER — OMEPRAZOLE 20 MG PO CPDR: 20.0000 mg | DELAYED_RELEASE_CAPSULE | Freq: Every day | ORAL | 0 refills | Status: DC

## 2017-06-23 MED ORDER — DIPHENHYDRAMINE HCL 50 MG/ML IJ SOLN
12.5000 mg | Freq: Once | INTRAMUSCULAR | Status: AC
  Administered 2017-06-23: 12.5 mg via INTRAVENOUS
  Filled 2017-06-23: qty 1

## 2017-06-23 MED ORDER — METOCLOPRAMIDE HCL 5 MG/ML IJ SOLN
10.0000 mg | Freq: Once | INTRAMUSCULAR | Status: AC
Start: 2017-06-23 — End: 2017-06-23
  Administered 2017-06-23: 10 mg via INTRAVENOUS
  Filled 2017-06-23: qty 2

## 2017-06-23 MED ORDER — CAPSAICIN 0.025 % EX CREA
TOPICAL_CREAM | Freq: Three times a day (TID) | CUTANEOUS | Status: DC
Start: 2017-06-23 — End: 2017-06-23
  Administered 2017-06-23: 09:00:00 via TOPICAL
  Filled 2017-06-23 (×2): qty 60

## 2017-06-23 MED ORDER — AZITHROMYCIN 250 MG PO TABS
1000.0000 mg | ORAL_TABLET | Freq: Once | ORAL | Status: AC
  Administered 2017-06-23: 1000 mg via ORAL
  Filled 2017-06-23: qty 4

## 2017-06-23 MED ORDER — LIDOCAINE HCL 1 % IJ SOLN
INTRAMUSCULAR | Status: AC
  Administered 2017-06-23: 0.9 mL
  Filled 2017-06-23: qty 20

## 2017-06-23 NOTE — ED Provider Notes (Signed)
Pt had continued vomiting.   Pt given reglan and benadryl IV.  Pt reports feeling better.  No further vomitting   Casey Sharp, [place] 06/23/17 1103    Margette Fast, MD 06/23/17 Karl Bales

## 2017-06-23 NOTE — ED Provider Notes (Signed)
[facility] DEPT Provider Note   CSN: [id number] Arrival date & time: 06/23/17  0217     History   Chief Complaint Chief Complaint  Patient presents with  . Abdominal Pain    HPI Casey Sharp is a 26 y.o. female with a hx of seasonal allergies presents to the Emergency Department complaining of gradual, persistent, progressively worsening lower abd pain onset 3 days ago.  Pt reports she smokes marijuana intermittently and pain began after this.  Pain is improved by warm showers with no known aggravating factors.  Pt reports she developed vaginal discharge today and is convinced that these symptoms mean she has an STD.  She has 1 female sexual partner at this time.  Pt reports previous STDs.  She is not using any contraception or condoms.  G1P0101.  LMP: 5 weeks ago.  Pt denies fever, chills, headache, neck pain, chest pain, SOB.  SHe does reports emesis of stomach contents several times over the last few days.  .     The history is provided by the patient and medical records. No language interpreter was used.    Past Medical History:  Diagnosis Date  . Seasonal allergies     There are no active problems to display for this patient.   History reviewed. No pertinent surgical history.  OB History    No data available       Home Medications    Prior to Admission medications   Medication Sig Start Date End Date Taking? Authorizing Provider  acetaminophen (TYLENOL) 500 MG tablet Take 1,000 mg by mouth every 6 (six) hours as needed for mild pain or headache.    [provider]  naproxen (NAPROSYN) 375 MG tablet Take 1 tablet (375 mg total) by mouth 2 (two) times daily. 08/08/13   Junius Creamer, NP  omeprazole (PRILOSEC) 20 MG capsule Take 1 capsule (20 mg total) by mouth daily. 06/23/17   Vic Esco, Jarrett Soho, PA-C    Family History History reviewed. No pertinent family history.  Social History Social History  Substance Use  Topics  . Smoking status: Former Research scientist (life sciences)  . Smokeless tobacco: Never Used     Comment: quit about 1 year ago  . Alcohol use No     Allergies   Penicillins   Review of Systems Review of Systems  Constitutional: Negative for appetite change, diaphoresis, fatigue, fever and unexpected weight change.  HENT: Negative for mouth sores.   Eyes: Negative for visual disturbance.  Respiratory: Negative for cough, chest tightness, shortness of breath and wheezing.   Cardiovascular: Negative for chest pain.  Gastrointestinal: Positive for abdominal pain, nausea and vomiting. Negative for constipation and diarrhea.  Endocrine: Negative for polydipsia, polyphagia and polyuria.  Genitourinary: Positive for vaginal discharge. Negative for dysuria, frequency, hematuria and urgency.  Musculoskeletal: Negative for back pain and neck stiffness.  Skin: Negative for rash.  Allergic/Immunologic: Negative for immunocompromised state.  Neurological: Negative for syncope, light-headedness and headaches.  Hematological: Does not bruise/bleed easily.  Psychiatric/Behavioral: Negative for sleep disturbance. The patient is not nervous/anxious.      Physical Exam Updated Vital Signs BP 127/71 (BP Location: Right Arm)   Pulse 82   Temp 98 F (36.7 C) (Oral)   Resp 18   Ht 5\' 5"  (1.651 m)   Wt 81.2 kg (179 lb)   LMP 06/02/2017   SpO2 100%   BMI 29.79 kg/m   Physical Exam  Constitutional: She appears well-developed and well-nourished. No distress.  Awake,  alert, nontoxic appearance  HENT:  Head: Normocephalic and atraumatic.  Mouth/Throat: Oropharynx is clear and moist. No oropharyngeal exudate.  Eyes: Conjunctivae are normal. No scleral icterus.  Neck: Normal range of motion. Neck supple.  Cardiovascular: Normal rate, regular rhythm, normal heart sounds and intact distal pulses.   No murmur heard. Pulmonary/Chest: Effort normal and breath sounds normal. No respiratory distress. She has no  wheezes.  Equal chest expansion  Abdominal: Soft. Bowel sounds are normal. She exhibits no mass. There is tenderness in the suprapubic area. There is no rigidity, no rebound, no guarding and no CVA tenderness. Hernia confirmed negative in the right inguinal area and confirmed negative in the left inguinal area.  Genitourinary: Uterus normal. No labial fusion. There is no rash, tenderness or lesion on the right labia. There is no rash, tenderness or lesion on the left labia. Uterus is not deviated, not enlarged, not fixed and not tender. Cervix exhibits no motion tenderness, no discharge and no friability. Right adnexum displays no mass, no tenderness and no fullness. Left adnexum displays no mass, no tenderness and no fullness. No erythema, tenderness or bleeding in the vagina. No foreign body in the vagina. No signs of injury around the vagina. Vaginal discharge ( small amount, white ) found.  Musculoskeletal: Normal range of motion. She exhibits no edema.  Lymphadenopathy:       Right: No inguinal adenopathy present.       Left: No inguinal adenopathy present.  Neurological: She is alert.  Speech is clear and goal oriented Moves extremities without ataxia  Skin: Skin is warm and dry. She is not diaphoretic. No erythema.  Psychiatric: Her mood appears anxious.  Pt is tearful and very anxious  Nursing note and vitals reviewed.    ED Treatments / Results  Labs (all labs ordered are listed, but only abnormal results are displayed) Labs Reviewed  WET PREP, GENITAL - Abnormal; Notable for the following:       Result Value   WBC, Wet Prep HPF POC FEW (*)    All other components within normal limits  URINALYSIS, ROUTINE W REFLEX MICROSCOPIC - Abnormal; Notable for the following:    Specific Gravity, Urine 1.039 (*)    Bilirubin Urine SMALL (*)    Ketones, ur 80 (*)    Protein, ur 30 (*)    Bacteria, UA RARE (*)    Squamous Epithelial / LPF 0-5 (*)    All other components within normal  limits  BASIC METABOLIC PANEL - Abnormal; Notable for the following:    Potassium 3.4 (*)    All other components within normal limits  CBC  POC URINE PREG, ED  GC/CHLAMYDIA PROBE AMP ([place]) NOT AT [facility]     Procedures Procedures (including critical care time)  Medications Ordered in ED Medications  sodium chloride 0.9 % bolus 1,000 mL (0 mLs Intravenous Stopped 06/23/17 0620)  ondansetron (ZOFRAN) injection 4 mg (4 mg Intravenous Given 06/23/17 0508)  cefTRIAXone (ROCEPHIN) injection 250 mg (250 mg Intramuscular Given 06/23/17 0549)  azithromycin (ZITHROMAX) tablet 1,000 mg (1,000 mg Oral Given 06/23/17 0548)  lidocaine (XYLOCAINE) 1 % (with pres) injection (0.9 mLs  Given 06/23/17 0549)  sodium chloride 0.9 % bolus 1,000 mL (1,000 mLs Intravenous New Bag/Given 06/23/17 0620)     Initial Impression / Assessment and Plan / ED Course  I have reviewed the triage vital signs and the nursing notes.  Pertinent labs & imaging results that were available during my care of the  patient were reviewed by me and considered in my medical decision making (see chart for details).     Pt returns to the ED after eloping now complaining of lower abd pain and concern for STD.  Pt understands that they have GC/Chlamydia cultures pending and that they will need to inform all sexual partners if results return positive. Pt has been treated prophylactically with azithromycin and Rocephin due to pts history, pelvic exam, and wet prep with increased WBCs.  Pt not concerning for PID because hemodynamically stable and no cervical motion tenderness on pelvic exam.  Patient to be discharged with instructions to follow up with OBGYN/PCP. Discussed importance of using protection when sexually active.   Cannabinoid hyperemesis syndrome.  Abdomen is soft without rebound or guarding.  Minimal tenderness in the suprapubic region.  No evidence of urinary tract infection.   6:56 AM Pt vomiting again.  Pt Will be  given Zofran and Pepcid.  Care transferred to [facility], PA-C who will monitor and PO trail. Anticipate discharge home.  Final Clinical Impressions(s) / ED Diagnoses   Final diagnoses:  Lower abdominal pain  Vaginal discharge  Possible exposure to STD  Cannabinoid hyperemesis syndrome (HCC)  Dehydration    New Prescriptions New Prescriptions   OMEPRAZOLE (PRILOSEC) 20 MG CAPSULE    Take 1 capsule (20 mg total) by mouth daily.     Abigail Butts, PA-C 06/23/17 [place], Delice Bison, DO 06/23/17 [phone]

## 2017-06-23 NOTE — Discharge Instructions (Signed)
1. Medications: usual home medications °2. Treatment: rest, drink plenty of fluids, use a condom with every sexual encounter °3. Follow Up: Please followup with your primary doctor in 3 days for discussion of your diagnoses and further evaluation after today's visit; if you do not have a primary care doctor use the resource guide provided to find one; Please return to the ER for worsening symptoms, high fevers or persistent vomiting. ° °You have been tested for HIV, syphilis, chlamydia and gonorrhea.  These results will be available in approximately 3 days.  Please inform all sexual partners if you test positive for any of these diseases. ° °

## 2017-06-23 NOTE — ED Provider Notes (Signed)
Patient eloped from the emergency department before provider evaluation. I did not see or evaluate this patient. Triage note states abdominal pain for 3 days.      [place] Vogelgesang, Jarrett Soho, PA-C 06/23/17 5320    W[place], Delice Bison, DO 06/23/17 0530

## 2017-06-23 NOTE — ED Triage Notes (Signed)
Pt complains of abdominal pain for three days and she noticed a yellow discharge today

## 2017-06-24 LAB — GC/CHLAMYDIA PROBE AMP (~~LOC~~) NOT AT ARMC
Chlamydia: POSITIVE — AB
NEISSERIA GONORRHEA: NEGATIVE

## 2017-06-30 NOTE — ED Notes (Signed)
06/30/2017, 12:04  Reviewed results with pt. And treatment .  Pt. Verbalized  Understanding of results and treatment. Pt. Requested a copy of results, Instructed pt. That she can call Medical Records and get results

## 2017-06-30 NOTE — ED Notes (Signed)
Received a message on this RN's office voicemail for a return call concerning lab results.  Called pt.s listed phone number, and message left for a return call.  06/30/2017, 8:57 am.

## 2017-12-18 ENCOUNTER — Encounter (HOSPITAL_COMMUNITY): Payer: Self-pay | Admitting: Emergency Medicine

## 2017-12-18 ENCOUNTER — Emergency Department (HOSPITAL_COMMUNITY)
Admission: EM | Admit: 2017-12-18 | Discharge: 2017-12-18 | Disposition: A | Discharge status: Home / Self Care | Payer: Self-pay | Attending: Emergency Medicine | Admitting: Emergency Medicine

## 2017-12-18 DIAGNOSIS — R1084 Generalized abdominal pain: Secondary | ICD-10-CM | POA: Insufficient documentation

## 2017-12-18 DIAGNOSIS — Z5321 Procedure and treatment not carried out due to patient leaving prior to being seen by health care provider: Secondary | ICD-10-CM | POA: Insufficient documentation

## 2017-12-18 LAB — CBC
HCT: 40.6 % (ref 36.0–46.0)
HEMOGLOBIN: 14 g/dL (ref 12.0–15.0)
MCH: 31 pg (ref 26.0–34.0)
MCHC: 34.5 g/dL (ref 30.0–36.0)
MCV: 89.8 fL (ref 78.0–100.0)
PLATELETS: 308 10*3/uL (ref 150–400)
RBC: 4.52 MIL/uL (ref 3.87–5.11)
RDW: 13.9 % (ref 11.5–15.5)
WBC: 10.2 10*3/uL (ref 4.0–10.5)

## 2017-12-18 LAB — COMPREHENSIVE METABOLIC PANEL
ALT: 19 U/L (ref 14–54)
ANION GAP: 15 (ref 5–15)
AST: 18 U/L (ref 15–41)
Albumin: 4.2 g/dL (ref 3.5–5.0)
Alkaline Phosphatase: 81 U/L (ref 38–126)
BUN: 9 mg/dL (ref 6–20)
CHLORIDE: 103 mmol/L (ref 101–111)
CO2: 19 mmol/L — ABNORMAL LOW (ref 22–32)
CREATININE: 0.69 mg/dL (ref 0.44–1.00)
Calcium: 9.9 mg/dL (ref 8.9–10.3)
Glucose, Bld: 118 mg/dL — ABNORMAL HIGH (ref 65–99)
POTASSIUM: 3.2 mmol/L — AB (ref 3.5–5.1)
Sodium: 137 mmol/L (ref 135–145)
Total Bilirubin: 1.5 mg/dL — ABNORMAL HIGH (ref 0.3–1.2)
Total Protein: 7.9 g/dL (ref 6.5–8.1)

## 2017-12-18 LAB — LIPASE, BLOOD: Lipase: 20 U/L (ref 11–51)

## 2017-12-18 LAB — I-STAT BETA HCG BLOOD, ED (MC, WL, AP ONLY)

## 2017-12-18 MED ORDER — FENTANYL CITRATE (PF) 100 MCG/2ML IJ SOLN
50.0000 ug | Freq: Once | INTRAMUSCULAR | Status: AC
  Administered 2017-12-18: 50 ug via INTRAVENOUS
  Filled 2017-12-18: qty 2

## 2017-12-18 MED ORDER — ONDANSETRON HCL 4 MG/2ML IJ SOLN
4.0000 mg | Freq: Once | INTRAMUSCULAR | Status: AC
  Administered 2017-12-18: 4 mg via INTRAVENOUS
  Filled 2017-12-18: qty 2

## 2017-12-18 MED ORDER — ONDANSETRON 4 MG PO TBDP: 4.0000 mg | ORAL_TABLET | Freq: Once | ORAL | Status: DC | PRN

## 2017-12-18 MED ORDER — KETOROLAC TROMETHAMINE 30 MG/ML IJ SOLN: 30.0000 mg | Freq: Once | INTRAMUSCULAR | Status: DC

## 2017-12-18 NOTE — ED Notes (Signed)
Pt called x3 for vitals recheck, no response.  

## 2017-12-18 NOTE — ED Triage Notes (Signed)
Pt states her period started today with cramping and nausea. Pt actively vomiting in triage.

## 2018-02-11 ENCOUNTER — Inpatient Hospital Stay (HOSPITAL_COMMUNITY)
Admission: AD | Admit: 2018-02-11 | Discharge: 2018-02-11 | Disposition: A | Discharge status: Home / Self Care | Payer: Self-pay | Source: Ambulatory Visit | Attending: Obstetrics & Gynecology | Admitting: Obstetrics & Gynecology

## 2018-02-11 ENCOUNTER — Inpatient Hospital Stay (HOSPITAL_COMMUNITY): Payer: Self-pay

## 2018-02-11 ENCOUNTER — Encounter (HOSPITAL_COMMUNITY): Payer: Self-pay

## 2018-02-11 DIAGNOSIS — D250 Submucous leiomyoma of uterus: Secondary | ICD-10-CM | POA: Insufficient documentation

## 2018-02-11 DIAGNOSIS — Z880 Allergy status to penicillin: Secondary | ICD-10-CM | POA: Insufficient documentation

## 2018-02-11 DIAGNOSIS — N946 Dysmenorrhea, unspecified: Secondary | ICD-10-CM | POA: Insufficient documentation

## 2018-02-11 DIAGNOSIS — R102 Pelvic and perineal pain: Secondary | ICD-10-CM | POA: Insufficient documentation

## 2018-02-11 DIAGNOSIS — Z87891 Personal history of nicotine dependence: Secondary | ICD-10-CM | POA: Insufficient documentation

## 2018-02-11 DIAGNOSIS — F1290 Cannabis use, unspecified, uncomplicated: Secondary | ICD-10-CM | POA: Insufficient documentation

## 2018-02-11 HISTORY — DX: Trichomoniasis, unspecified: A59.9

## 2018-02-11 HISTORY — DX: Chlamydial infection, unspecified: A74.9

## 2018-02-11 LAB — CBC
HCT: 38.6 % (ref 36.0–46.0)
Hemoglobin: 13 g/dL (ref 12.0–15.0)
MCH: 30.6 pg (ref 26.0–34.0)
MCHC: 33.7 g/dL (ref 30.0–36.0)
MCV: 90.8 fL (ref 78.0–100.0)
PLATELETS: 277 10*3/uL (ref 150–400)
RBC: 4.25 MIL/uL (ref 3.87–5.11)
RDW: 14.9 % (ref 11.5–15.5)
WBC: 5.9 10*3/uL (ref 4.0–10.5)

## 2018-02-11 LAB — URINALYSIS, ROUTINE W REFLEX MICROSCOPIC

## 2018-02-11 LAB — WET PREP, GENITAL
CLUE CELLS WET PREP: NONE SEEN
Sperm: NONE SEEN
Trich, Wet Prep: NONE SEEN
Yeast Wet Prep HPF POC: NONE SEEN

## 2018-02-11 LAB — RAPID URINE DRUG SCREEN, HOSP PERFORMED
AMPHETAMINES: POSITIVE — AB
Benzodiazepines: NOT DETECTED
Cocaine: NOT DETECTED
Opiates: NOT DETECTED
TETRAHYDROCANNABINOL: POSITIVE — AB

## 2018-02-11 LAB — POCT PREGNANCY, URINE: PREG TEST UR: NEGATIVE

## 2018-02-11 LAB — URINALYSIS, MICROSCOPIC (REFLEX)

## 2018-02-11 MED ORDER — IBUPROFEN 600 MG PO TABS: 600.0000 mg | ORAL_TABLET | Freq: Four times a day (QID) | ORAL | 1 refills | Status: AC | PRN

## 2018-02-11 MED ORDER — NORGESTIMATE-ETH ESTRADIOL 0.25-35 MG-MCG PO TABS: 1.0000 | ORAL_TABLET | Freq: Every day | ORAL | 11 refills | Status: AC

## 2018-02-11 MED ORDER — KETOROLAC TROMETHAMINE 60 MG/2ML IM SOLN
60.0000 mg | Freq: Once | INTRAMUSCULAR | Status: AC
  Administered 2018-02-11: 60 mg via INTRAMUSCULAR
  Filled 2018-02-11: qty 2

## 2018-02-11 NOTE — MAU Provider Note (Signed)
Chief Complaint: Abdominal Pain   None     SUBJECTIVE HPI: Casey Sharp is a 27 y.o. G1P1001 who presents to maternity admissions reporting onset of severe pelvic and abdominal cramping pain with onset of her period today. She reports painful regular periods since her child was born 16 years ago.  She has had multiple ED visits for her pain but does not have an OB/Gyn currently. She has tried hot shower, Tylenol for pain but nothing helps. She is writhing and moaning in pain in MAU.  Her bleeding is light today, dark red, which is c/w her usual first day of menses. It usually become heavier on Day 2 and 3.  There are no other associated symptoms. She denies other treatments.   HPI  Past Medical History:  Diagnosis Date  . Chlamydia   . Seasonal allergies   . Trichimoniasis    Past Surgical History:  Procedure Laterality Date  . NO PAST SURGERIES     Social History   Socioeconomic History  . Marital status: Married    Spouse name: Not on file  . Number of children: Not on file  . Years of education: Not on file  . Highest education level: Not on file  Occupational History  . Not on file  Social Needs  . Financial resource strain: Not on file  . Food insecurity:    Worry: Not on file    Inability: Not on file  . Transportation needs:    Medical: Not on file    Non-medical: Not on file  Tobacco Use  . Smoking status: Former Research scientist (life sciences)  . Smokeless tobacco: Never Used  . Tobacco comment: quit about 1 year ago  Substance and Sexual Activity  . Alcohol use: No  . Drug use: No  . Sexual activity: Yes    Birth control/protection: Condom  Lifestyle  . Physical activity:    Days per week: Not on file    Minutes per session: Not on file  . Stress: Not on file  Relationships  . Social connections:    Talks on phone: Not on file    Gets together: Not on file    Attends religious service: Not on file    Active member of club or organization: Not on file    Attends  meetings of clubs or organizations: Not on file    Relationship status: Not on file  . Intimate partner violence:    Fear of current or ex partner: Not on file    Emotionally abused: Not on file    Physically abused: Not on file    Forced sexual activity: Not on file  Other Topics Concern  . Not on file  Social History Narrative  . Not on file   No current facility-administered medications on file prior to encounter.    No current outpatient medications on file prior to encounter.   Allergies  Allergen Reactions  . Penicillins Other (See Comments)    Has patient had a PCN reaction causing immediate rash, facial/tongue/throat swelling, SOB or lightheadedness with hypotension: Unknown Has patient had a PCN reaction causing severe rash involving mucus membranes or skin necrosis: Unknown Has patient had a PCN reaction that required hospitalization: Unknown Has patient had a PCN reaction occurring within the last 10 years: Unknown If all of the above answers are "NO", then may proceed with Cephalosporin use. unknown    ROS:  Review of Systems  Constitutional: Negative for chills, fatigue and fever.  Respiratory: Negative  for shortness of breath.   Cardiovascular: Negative for chest pain.  Gastrointestinal: Positive for abdominal pain. Negative for nausea and vomiting.  Genitourinary: Positive for pelvic pain and vaginal bleeding. Negative for difficulty urinating, dysuria, flank pain, vaginal discharge and vaginal pain.  Neurological: Negative for dizziness and headaches.  Psychiatric/Behavioral: Negative.      I have reviewed patient's Past Medical Hx, Surgical Hx, Family Hx, Social Hx, medications and allergies.   Physical Exam   Patient Vitals for the past 24 hrs:  BP Temp Temp src Pulse Resp SpO2 Weight  02/11/18 1437 128/63 - - 89 16 - -  02/11/18 1104 128/73 98 F (36.7 C) Oral (!) 105 - 100 % 168 lb (76.2 kg)   Constitutional: Well-developed, well-nourished female  in no acute distress.  Cardiovascular: normal rate Respiratory: normal effort GI: Abd soft, non-tender. Pos BS x 4 MS: Extremities nontender, no edema, normal ROM Neurologic: Alert and oriented x 4.  GU: Neg CVAT.  PELVIC EXAM: Cervix pink, visually closed, without lesion, moderate amount dark red bleeding requiring 1 fox swab to visualize cervix, vaginal walls and external genitalia normal Bimanual exam: Cervix 0/long/high, firm, anterior, neg CMT, uterus with significant tenderness, nonenlarged, adnexa with tenderness on the right, no enlargement or mass, left wnl    LAB RESULTS Results for orders placed or performed during the hospital encounter of 02/11/18 (from the past 24 hour(s))  Wet prep, genital     Status: Abnormal   Collection Time: 02/11/18 11:13 AM  Result Value Ref Range   Yeast Wet Prep HPF POC NONE SEEN NONE SEEN   Trich, Wet Prep NONE SEEN NONE SEEN   Clue Cells Wet Prep HPF POC NONE SEEN NONE SEEN   WBC, Wet Prep HPF POC FEW (A) NONE SEEN   Sperm NONE SEEN   Urinalysis, Routine w reflex microscopic     Status: Abnormal   Collection Time: 02/11/18 11:16 AM  Result Value Ref Range   Color, Urine RED (A) YELLOW   APPearance TURBID (A) CLEAR   Specific Gravity, Urine  1.005 - 1.030    TEST NOT REPORTED DUE TO COLOR INTERFERENCE OF URINE PIGMENT   pH  5.0 - 8.0    TEST NOT REPORTED DUE TO COLOR INTERFERENCE OF URINE PIGMENT   Glucose, UA (A) NEGATIVE mg/dL    TEST NOT REPORTED DUE TO COLOR INTERFERENCE OF URINE PIGMENT   Hgb urine dipstick (A) NEGATIVE    TEST NOT REPORTED DUE TO COLOR INTERFERENCE OF URINE PIGMENT   Bilirubin Urine (A) NEGATIVE    TEST NOT REPORTED DUE TO COLOR INTERFERENCE OF URINE PIGMENT   Ketones, ur (A) NEGATIVE mg/dL    TEST NOT REPORTED DUE TO COLOR INTERFERENCE OF URINE PIGMENT   Protein, ur (A) NEGATIVE mg/dL    TEST NOT REPORTED DUE TO COLOR INTERFERENCE OF URINE PIGMENT   Nitrite (A) NEGATIVE    TEST NOT REPORTED DUE TO COLOR  INTERFERENCE OF URINE PIGMENT   Leukocytes, UA (A) NEGATIVE    TEST NOT REPORTED DUE TO COLOR INTERFERENCE OF URINE PIGMENT  Urinalysis, Microscopic (reflex)     Status: Abnormal   Collection Time: 02/11/18 11:16 AM  Result Value Ref Range   RBC / HPF 21-50 0 - 5 RBC/hpf   WBC, UA 21-50 0 - 5 WBC/hpf   Bacteria, UA RARE (A) NONE SEEN   Squamous Epithelial / LPF 6-10 0 - 5  Urine rapid drug screen (hosp performed)     Status: Abnormal  Collection Time: 02/11/18 11:16 AM  Result Value Ref Range   Opiates NONE DETECTED NONE DETECTED   Cocaine NONE DETECTED NONE DETECTED   Benzodiazepines NONE DETECTED NONE DETECTED   Amphetamines POSITIVE (A) NONE DETECTED   Tetrahydrocannabinol POSITIVE (A) NONE DETECTED   Barbiturates (A) NONE DETECTED    Result not available. Reagent lot number recalled by manufacturer.  Pregnancy, urine POC     Status: None   Collection Time: 02/11/18 11:20 AM  Result Value Ref Range   Preg Test, Ur NEGATIVE NEGATIVE  CBC     Status: None   Collection Time: 02/11/18 12:36 PM  Result Value Ref Range   WBC 5.9 4.0 - 10.5 K/uL   RBC 4.25 3.87 - 5.11 MIL/uL   Hemoglobin 13.0 12.0 - 15.0 g/dL   HCT 38.6 36.0 - 46.0 %   MCV 90.8 78.0 - 100.0 fL   MCH 30.6 26.0 - 34.0 pg   MCHC 33.7 30.0 - 36.0 g/dL   RDW 14.9 11.5 - 15.5 %   Platelets 277 150 - 400 K/uL       IMAGING US Pelvic Complete W Transvaginal And Torsion R/o  Result Date: 02/11/2018 CLINICAL DATA:  Acute pelvic pain in a female EXAM: TRANSABDOMINAL AND TRANSVAGINAL ULTRASOUND OF PELVIS TECHNIQUE: Both transabdominal and transvaginal ultrasound examinations of the pelvis were performed. Transabdominal technique was performed for global imaging of the pelvis including uterus, ovaries, adnexal regions, and pelvic cul-de-sac. It was necessary to proceed with endovaginal exam following the transabdominal exam to visualize the uterus and endometrium. COMPARISON:  None FINDINGS: Uterus Measurements: 8.9 x 4.3  x 5.0 cm. Three small uterine leiomyomata are identified. Small posterior upper uterine leiomyoma 16 x 11 x 11 mm, submucosal. Small subserosal fundal leiomyoma 13 x 9 x 11 mm. Additional anterior leiomyoma, intramural, 10 x 11 x 9 mm. Endometrium Thickness: 5 mm thick.  No endometrial fluid or focal abnormality Right ovary Measurements: 3.3 x 2.2 x 2.7 cm. Normal morphology without mass. Internal blood flow present on color Doppler imaging. Left ovary Measurements: 2.5 x 1.9 x 2.0 cm. Normal morphology without mass. Internal blood flow present on color Doppler imaging. Other findings Trace free pelvic fluid.  No adnexal masses. IMPRESSION: 3 small uterine leiomyomata identified, largest of which 16 mm diameter at the posterior upper uterus extend submucosal. Unremarkable ovaries. Electronically Signed   By: Lavonia Dana M.D.   On: 02/11/2018 13:38    MAU Management/MDM: Ordered labs and [place] and reviewed results. No evidence of ovarian torsion.  No acute abdomen on exam. Pt with long hx of painful menses.  Small fibroids found on [place] today, otherwise unremarkable. These may contribute to pain or be incidental.  Pt pain is suspicious for endometriosis.  Will start on OCPs and NSAIDs and f/u outpatient in the office in 3 months.  Pt denies any personal or family hx of DVT/PE, migraines with aura, or cigarette smoking. She does occasional smoke marijuana. Warning signs with OCPs and DVT/PE given, pt to return to ED with emergencies.  F/U in office as scheduled, return to MAU for ob/gyn emergencies.   Treatments in MAU included Toradol 60 mg IM, with complete resolution of pt pain. Pt discharged with strict return precautions.  ASSESSMENT 1. Severe dysmenorrhea   2. Acute pelvic pain, female   3. Submucous leiomyoma of uterus     PLAN Discharge home  Allergies as of 02/11/2018      Reactions   Penicillins Other (See Comments)  Has patient had a PCN reaction causing immediate rash, facial/tongue/throat  swelling, SOB or lightheadedness with hypotension: Unknown Has patient had a PCN reaction causing severe rash involving mucus membranes or skin necrosis: Unknown Has patient had a PCN reaction that required hospitalization: Unknown Has patient had a PCN reaction occurring within the last 10 years: Unknown If all of the above answers are "NO", then may proceed with Cephalosporin use. unknown      Medication List    TAKE these medications   ibuprofen 600 MG tablet Commonly known as:  ADVIL,MOTRIN Take 1 tablet (600 mg total) by mouth every 6 (six) hours as needed.   norgestimate-ethinyl estradiol 0.25-35 MG-MCG tablet Commonly known as:  ORTHO-CYCLEN,SPRINTEC,PREVIFEM Take 1 tablet by mouth daily.      Follow-up [facility] Follow up.   Specialty:  Obstetrics and Gynecology Why:  The office will call you or call the number listed to make an appointment in 3 months. Return to MAU as needed for emergencies.  Contact information: [place] Certified Nurse-Midwife 02/11/2018  2:46 PM

## 2018-02-11 NOTE — MAU Note (Signed)
Pt states she has severe pain every menstrual cycle. Has vomiting with her period. Pt was in the lobby in tears and naked except for a blanket from home. I asked if she sees a doctor and she said every time she goes all they do is check for STD's and do a pap. I asked if she goes to a doctor or the ED, she states she goes to the ED.   Pt states bleeding is light today and usually increases to moderate bleeding during her cycle.

## 2018-02-12 LAB — GC/CHLAMYDIA PROBE AMP (~~LOC~~) NOT AT ARMC
Chlamydia: NEGATIVE
Neisseria Gonorrhea: NEGATIVE

## 2018-04-27 ENCOUNTER — Emergency Department (HOSPITAL_COMMUNITY)
Admission: EM | Admit: 2018-04-27 | Discharge: 2018-04-27 | Disposition: A | Discharge status: Home / Self Care | Payer: Self-pay | Attending: Emergency Medicine | Admitting: Emergency Medicine

## 2018-04-27 ENCOUNTER — Other Ambulatory Visit: Payer: Self-pay

## 2018-04-27 ENCOUNTER — Encounter (HOSPITAL_COMMUNITY): Payer: Self-pay | Admitting: Emergency Medicine

## 2018-04-27 DIAGNOSIS — Z79899 Other long term (current) drug therapy: Secondary | ICD-10-CM | POA: Insufficient documentation

## 2018-04-27 DIAGNOSIS — Z87891 Personal history of nicotine dependence: Secondary | ICD-10-CM | POA: Insufficient documentation

## 2018-04-27 DIAGNOSIS — K0889 Other specified disorders of teeth and supporting structures: Secondary | ICD-10-CM | POA: Insufficient documentation

## 2018-04-27 MED ORDER — ACETAMINOPHEN 500 MG PO TABS
1000.0000 mg | ORAL_TABLET | Freq: Once | ORAL | Status: AC
  Administered 2018-04-27: 1000 mg via ORAL
  Filled 2018-04-27: qty 2

## 2018-04-27 MED ORDER — CLINDAMYCIN HCL 150 MG PO CAPS: 300.0000 mg | ORAL_CAPSULE | Freq: Three times a day (TID) | ORAL | 0 refills | Status: AC

## 2018-04-27 MED ORDER — IBUPROFEN 800 MG PO TABS
800.0000 mg | ORAL_TABLET | Freq: Once | ORAL | Status: AC
  Administered 2018-04-27: 800 mg via ORAL
  Filled 2018-04-27: qty 1

## 2018-04-27 NOTE — ED Triage Notes (Signed)
Pt arriving via [facility] for dental pain. Pt has a tooth in the lower back portion that broke off appox 1 month ago. Tooth is now infected.

## 2018-04-27 NOTE — Discharge Instructions (Addendum)
You were seen in the ER for dental pain.  There are no signs of infection however you are at higher risk for this given a cracked tooth.  We will treat you with clindamycin.  Unfortunately, your pain will continue until you see a dentist.  Call Dr. Radford Pax and/or Dr Mariel Sleet as soon as possible to try to get an appointment for evaluation.  For pain control, take 1000 mg of acetaminophen +600 mg of ibuprofen every 6-8 hours.  Stick to a soft, lukewarm diet to avoid further inflammation and pain.  Apply Orajel to the area affected minutes before eating.  Return to the ER for fevers greater than 100.4, facial or gumline swelling, pus, difficulty opening jaw, neck pain or stiffness, neck swelling.

## 2018-04-27 NOTE — ED Provider Notes (Signed)
[facility] DEPT Provider Note   CSN: [id number] Arrival date & time: 04/27/18  0013     History   Chief Complaint Chief Complaint  Patient presents with  . Dental Pain    HPI Casey Sharp is a 27 y.o. female here for evaluation of dental pain onset 2 days ago.  Pain is localized to the right bottom tooth.  Pain is constant, dull, 10/10, nonradiating.  Associated with right-sided headache.  States she has not been able to eat or drink due to the severe pain.  No interventions for this.  Aggravated with cold fluids and chewing.  States she has tried to go to free dental events twice but has not been able to see a dentist.  She denies associated fevers, chills, facial swelling.  HPI  Past Medical History:  Diagnosis Date  . Chlamydia   . Seasonal allergies   . Trichimoniasis     There are no active problems to display for this patient.   Past Surgical History:  Procedure Laterality Date  . NO PAST SURGERIES       OB History    Gravida  1   Para  1   Term  1   Preterm      AB      Living  1     SAB      TAB      Ectopic      Multiple      Live Births  1            Home Medications    Prior to Admission medications   Medication Sig Start Date End Date Taking? Authorizing Provider  norgestimate-ethinyl estradiol (ORTHO-CYCLEN,SPRINTEC,PREVIFEM) 0.25-35 MG-MCG tablet Take 1 tablet by mouth daily. 02/11/18  Yes Leftwich-Kirby, Kathie Dike, CNM  clindamycin (CLEOCIN) 150 MG capsule Take 2 capsules (300 mg total) by mouth 3 (three) times daily for 7 days. 04/27/18 05/04/18  Kinnie Feil, PA-C  ibuprofen (ADVIL,MOTRIN) 600 MG tablet Take 1 tablet (600 mg total) by mouth every 6 (six) hours as needed. Patient not taking: Reported on 04/27/2018 02/11/18   Elvera Maria, CNM    Family History No family history on file.  Social History Social History   Tobacco Use  . Smoking status: Former Research scientist (life sciences)  . Smokeless  tobacco: Never Used  . Tobacco comment: quit about 1 year ago  Substance Use Topics  . Alcohol use: No  . Drug use: No     Allergies   Penicillins   Review of Systems Review of Systems  HENT: Positive for dental problem.   All other systems reviewed and are negative.    Physical Exam Updated Vital Signs BP 124/78   Pulse 84   Resp 18   SpO2 99%   Physical Exam  Constitutional: She is oriented to person, place, and time. She appears well-developed and well-nourished.  Non-toxic appearance.  HENT:  Head: Normocephalic.  Right Ear: External ear normal.  Left Ear: External ear normal.  Nose: Nose normal.  Right bottom last molar significantly cracked with deep cavity centrally.  No surrounding gumline erythema, edema, fluctuance, drainage. No trismus. MMM. Oropharynx and tonsils normal. No SL edema or tenderness. Normal protrusion of tongue. Normal voice. No facial or anterior neck edema.  Eyes: Conjunctivae and EOM are normal.  Neck: Full passive range of motion without pain.  Cardiovascular: Normal rate.  Pulmonary/Chest: Effort normal. No tachypnea. No respiratory distress.  Musculoskeletal: Normal range of motion.  Neurological: She is alert and oriented to person, place, and time.  Skin: Skin is warm and dry. Capillary refill takes less than 2 seconds.  Psychiatric: Her behavior is normal. Thought content normal.     ED Treatments / Results  Labs (all labs ordered are listed, but only abnormal results are displayed) Labs Reviewed - No data to display  EKG None  Radiology No results found.  Procedures Procedures (including critical care time)  Medications Ordered in ED Medications  acetaminophen (TYLENOL) tablet 1,000 mg (1,000 mg Oral Given 04/27/18 0141)  ibuprofen (ADVIL,MOTRIN) tablet 800 mg (800 mg Oral Given 04/27/18 0141)     Initial Impression / Assessment and Plan / ED Course  I have reviewed the triage vital signs and the nursing  notes.  Pertinent labs & imaging results that were available during my care of the patient were reviewed by me and considered in my medical decision making (see chart for details).     Dental pain associated with dental cary and cracked tooth but no signs or symptoms of dental abscess amenable to I&D on exam with patient afebrile, non toxic appearing, swallowing secretions well without hot potato voice. Exam unconcerning for Ludwig's angina or other deep tissue infection in neck.  Will dc with abx as pt hopes to get extractions this week with dentist. Eliott Nine patient to follow-up with dentist.  Patient given dentist contact info, encouraged to follow up in 1-2 days for ultimate management of dental pain and overall dental health. Strict ED return precautions given. Pt is aware of red flag symptoms that would warrant return to ED for re-evaluation and further treatment. Patient voices understanding and is agreeable to plan.     Final Clinical Impressions(s) / ED Diagnoses   Final diagnoses:  Pain, dental    ED Discharge Orders         Ordered    clindamycin (CLEOCIN) 150 MG capsule  3 times daily     04/27/18 0157           Kinnie Feil, PA-C 04/27/18 Edman Circle, MD 04/27/18 [phone]

## 2018-10-04 ENCOUNTER — Encounter (HOSPITAL_COMMUNITY): Payer: Self-pay

## 2018-10-04 ENCOUNTER — Emergency Department (HOSPITAL_COMMUNITY)
Admission: EM | Admit: 2018-10-04 | Discharge: 2018-10-04 | Disposition: A | Discharge status: Home / Self Care | Payer: Self-pay | Attending: Emergency Medicine | Admitting: Emergency Medicine

## 2018-10-04 ENCOUNTER — Other Ambulatory Visit: Payer: Self-pay

## 2018-10-04 DIAGNOSIS — R52 Pain, unspecified: Secondary | ICD-10-CM | POA: Insufficient documentation

## 2018-10-04 DIAGNOSIS — Z5321 Procedure and treatment not carried out due to patient leaving prior to being seen by health care provider: Secondary | ICD-10-CM | POA: Insufficient documentation
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
## 2018-11-22 ENCOUNTER — Encounter (HOSPITAL_BASED_OUTPATIENT_CLINIC_OR_DEPARTMENT_OTHER): Payer: Self-pay

## 2018-11-22 ENCOUNTER — Emergency Department (HOSPITAL_BASED_OUTPATIENT_CLINIC_OR_DEPARTMENT_OTHER)
Admission: EM | Admit: 2018-11-22 | Discharge: 2018-11-23 | Disposition: A | Discharge status: Home / Self Care | Payer: Self-pay | Attending: Emergency Medicine | Admitting: Emergency Medicine

## 2018-11-22 ENCOUNTER — Other Ambulatory Visit: Payer: Self-pay

## 2018-11-22 DIAGNOSIS — R102 Pelvic and perineal pain: Secondary | ICD-10-CM | POA: Insufficient documentation

## 2018-11-22 DIAGNOSIS — Z79899 Other long term (current) drug therapy: Secondary | ICD-10-CM | POA: Insufficient documentation

## 2018-11-22 DIAGNOSIS — Z87891 Personal history of nicotine dependence: Secondary | ICD-10-CM | POA: Insufficient documentation

## 2018-11-22 DIAGNOSIS — N946 Dysmenorrhea, unspecified: Secondary | ICD-10-CM | POA: Insufficient documentation

## 2018-11-22 LAB — URINALYSIS, ROUTINE W REFLEX MICROSCOPIC
Glucose, UA: NEGATIVE mg/dL
Ketones, ur: >80 mg/dL — AB
NITRITE: NEGATIVE
Protein, ur: 30 mg/dL — AB
SPECIFIC GRAVITY, URINE: 1.01 (ref 1.005–1.030)

## 2018-11-22 LAB — PREGNANCY, URINE: PREG TEST UR: NEGATIVE

## 2018-11-22 LAB — URINALYSIS, MICROSCOPIC (REFLEX): RBC / HPF: >50 RBC/hpf (ref 0–5)

## 2018-11-22 LAB — WET PREP, GENITAL
Clue Cells Wet Prep HPF POC: NONE SEEN
Sperm: NONE SEEN
TRICH WET PREP: NONE SEEN
YEAST WET PREP: NONE SEEN

## 2018-11-22 MED ORDER — ONDANSETRON HCL 4 MG/2ML IJ SOLN
4.0000 mg | Freq: Once | INTRAMUSCULAR | Status: AC
  Administered 2018-11-22: 4 mg via INTRAVENOUS
  Filled 2018-11-22: qty 2

## 2018-11-22 MED ORDER — ACETAMINOPHEN 500 MG PO TABS
1000.0000 mg | ORAL_TABLET | Freq: Once | ORAL | Status: AC
  Administered 2018-11-22: 1000 mg via ORAL
  Filled 2018-11-22: qty 2

## 2018-11-22 MED ORDER — KETOROLAC TROMETHAMINE 30 MG/ML IJ SOLN
30.0000 mg | Freq: Once | INTRAMUSCULAR | Status: AC
  Administered 2018-11-22: 30 mg via INTRAVENOUS
  Filled 2018-11-22: qty 1

## 2018-11-22 NOTE — ED Triage Notes (Addendum)
Pt entered triage in w/c rocking back and forth/hysterical-states "I have a menstrual disorder"-c/o abd pain x 1 hour

## 2018-11-22 NOTE — ED Notes (Signed)
Patient is tossing in bed stating that she can not take the pain.  First day of her menstrual period.

## 2018-11-23 LAB — CBG MONITORING, ED: Glucose-Capillary: 123 mg/dL — ABNORMAL HIGH (ref 70–99)

## 2018-11-23 MED ORDER — PROMETHAZINE HCL 25 MG/ML IJ SOLN: 25.0000 mg | Freq: Once | INTRAMUSCULAR | Status: DC

## 2018-11-23 MED ORDER — PROMETHAZINE HCL 25 MG/ML IJ SOLN
INTRAMUSCULAR | Status: AC
  Filled 2018-11-23: qty 1

## 2018-11-23 MED ORDER — ONDANSETRON 4 MG PO TBDP: 4.0000 mg | ORAL_TABLET | Freq: Three times a day (TID) | ORAL | 0 refills | Status: AC | PRN

## 2018-11-23 MED ORDER — NAPROXEN 500 MG PO TABS: 500.0000 mg | ORAL_TABLET | Freq: Two times a day (BID) | ORAL | 0 refills | Status: AC

## 2018-11-23 MED ORDER — PROMETHAZINE HCL 25 MG/ML IJ SOLN
12.5000 mg | Freq: Once | INTRAMUSCULAR | Status: AC
  Administered 2018-11-23: 12.5 mg via INTRAVENOUS

## 2018-11-23 NOTE — ED Provider Notes (Signed)
[facility] EMERGENCY DEPARTMENT Provider Note   CSN: [id number] Arrival date & time: 11/22/18  2127    History   Chief Complaint Chief Complaint  Patient presents with   Dysmenorrhea    HPI Casey Sharp is a 28 y.o. female.     HPI  Patient is a 28 year old female with a history of allergic rhinitis and STI presenting for crampy lower abdominal pain.  Patient reports that she began her menses today, and she reports that this is the same pain that she gets with her menses each month but worse.  Patient reports that she was diagnosed with premenstrual dysphoric disorder previously.  She does not have regular OB/GYN care.  Patient reports the pain is crampy in nature and coming in waves.  She denies any focal lower abdominal pain in the right lower or left lower quadrant.  Patient denies any dysuria, urgency, or frequency.  Patient denies any vaginal discharge.  Patient reports she is sexually active with one female partner within the last 3 months. Denies concerns about STI exposure. Patient reports that she has been vomiting which is consistent with her history of severe dysmenorrhea.  She reports that this occurs with each menstrual period.  Patient reports that she is on oral contraceptive pills but she does not believe that they are helping her dysmenorrhea.  Past Medical History:  Diagnosis Date   Chlamydia    Seasonal allergies    Trichimoniasis     There are no active problems to display for this patient.   Past Surgical History:  Procedure Laterality Date   NO PAST SURGERIES       OB History    Gravida  1   Para  1   Term  1   Preterm      AB      Living  1     SAB      TAB      Ectopic      Multiple      Live Births  1            Home Medications    Prior to Admission medications   Medication Sig Start Date End Date Taking? Authorizing Provider  norgestimate-ethinyl estradiol (ORTHO-CYCLEN,SPRINTEC,PREVIFEM) 0.25-35  MG-MCG tablet Take 1 tablet by mouth daily. 02/11/18  Yes Leftwich-Kirby, Kathie Dike, CNM  ibuprofen (ADVIL,MOTRIN) 600 MG tablet Take 1 tablet (600 mg total) by mouth every 6 (six) hours as needed. Patient not taking: Reported on 04/27/2018 02/11/18   Elvera Maria, CNM    Family History No family history on file.  Social History Social History   Tobacco Use   Smoking status: Former Smoker   Smokeless tobacco: Never Used   Tobacco comment: quit about 1 year ago  Substance Use Topics   Alcohol use: No   Drug use: No     Allergies   Penicillins   Review of Systems Review of Systems  Constitutional: Negative for chills and fever.  HENT: Negative for congestion and sore throat.   Eyes: Negative for visual disturbance.  Respiratory: Negative for shortness of breath.   Cardiovascular: Negative for chest pain.  Gastrointestinal: Positive for abdominal pain, nausea and vomiting. Negative for diarrhea.  Genitourinary: Positive for menstrual problem and pelvic pain. Negative for dysuria, flank pain, frequency and urgency.  Musculoskeletal: Negative for back pain and myalgias.  Skin: Negative for rash.  Neurological: Negative for dizziness, syncope, light-headedness and headaches.     Physical Exam Updated  Vital Signs BP (!) 132/92 (BP Location: Left Arm)    Pulse 64    Temp 98 F (36.7 C) (Oral)    Resp 20    LMP 11/22/2018    SpO2 100%   Physical Exam Vitals signs and nursing note reviewed.  Constitutional:      General: She is not in acute distress.    Appearance: She is well-developed.  HENT:     Head: Normocephalic and atraumatic.     Mouth/Throat:     Mouth: Mucous membranes are moist.  Eyes:     Conjunctiva/sclera: Conjunctivae normal.     Pupils: Pupils are equal, round, and reactive to light.  Neck:     Musculoskeletal: Normal range of motion and neck supple.  Cardiovascular:     Rate and Rhythm: Normal rate and regular rhythm.     Heart sounds: S1  normal and S2 normal. No murmur.  Pulmonary:     Effort: Pulmonary effort is normal.     Breath sounds: Normal breath sounds. No wheezing or rales.  Abdominal:     General: There is no distension.     Palpations: Abdomen is soft.     Tenderness: There is abdominal tenderness. There is no guarding.     Comments: Suprapubic TTP.   Genitourinary:    Comments: Pelvic examination performed with RN chaperone present.  No external lesions of the vagina or perineum.  Vaginal tissue pink and rugated.  Cervix nonerythematous and nonfriable.  Clots of blood present in vaginal vault.  Minimal vaginal discharge. On bimanual exam, patient has no cervical motion tenderness, no uterine tenderness, and no adnexal tenderness.  Ovaries palpated without masses. Musculoskeletal: Normal range of motion.        General: No deformity.  Lymphadenopathy:     Cervical: No cervical adenopathy.  Skin:    General: Skin is warm and dry.     Findings: No erythema or rash.  Neurological:     Mental Status: She is alert.     Comments: Cranial nerves grossly intact. Patient moves extremities symmetrically and with good coordination.  Psychiatric:        Behavior: Behavior normal.        Thought Content: Thought content normal.        Judgment: Judgment normal.      ED Treatments / Results  Labs (all labs ordered are listed, but only abnormal results are displayed) Labs Reviewed  WET PREP, GENITAL - Abnormal; Notable for the following components:      Result Value   WBC, Wet Prep HPF POC MANY (*)    All other components within normal limits  URINALYSIS, ROUTINE W REFLEX MICROSCOPIC - Abnormal; Notable for the following components:   APPearance CLOUDY (*)    pH >9.0 (*)    Hgb urine dipstick LARGE (*)    Bilirubin Urine SMALL (*)    Ketones, ur >80 (*)    Protein, ur 30 (*)    Leukocytes,Ua SMALL (*)    All other components within normal limits  URINALYSIS, MICROSCOPIC (REFLEX) - Abnormal; Notable for the  following components:   Bacteria, UA MANY (*)    All other components within normal limits  PREGNANCY, URINE  GC/CHLAMYDIA PROBE AMP ([place]) NOT AT [facility]    EKG None  Radiology No results found.  Procedures Procedures (including critical care time)  Medications Ordered in ED Medications  ondansetron (ZOFRAN) injection 4 mg (4 mg Intravenous Given 11/22/18 2231)  acetaminophen (TYLENOL) tablet  1,000 mg (1,000 mg Oral Given 11/22/18 2230)  ketorolac (TORADOL) 30 MG/ML injection 30 mg (30 mg Intravenous Given 11/22/18 2303)     Initial Impression / Assessment and Plan / ED Course  I have reviewed the triage vital signs and the nursing notes.  Pertinent labs & imaging results that were available during my care of the patient were reviewed by me and considered in my medical decision making (see chart for details).  Clinical Course as of Nov 23 106  Mon Nov 22, 2018  2307 Pt is not having specific urinary symptoms.  Urinalysis, Routine w reflex microscopic(!) [AM]  2354 Pt menstruating.   Hgb urine dipstick(!): LARGE [AM]  Tue Nov 23, 2018  0016 Exam not consistent with PID. Minimal discharge on exam.   WBC, Wet Prep HPF POC(!): MANY [AM]  0106 Checked CBG, which is nonelevated.   Ketones, ur(!): >80 [AM]  0107 Patient had further episodes of vomiting.  Patient reports that she had already called her sister and intends to go home.  Feel that this is reasonable.  I did discuss with patient if she continues to vomit, she may need further antiemetics or fluid.  She was instructed to return to the emergency department if she has any worsening pain or inability to tolerate p.o.  In no acute distress on final examination.   [AM]    Clinical Course User Index [AM] Albesa Seen, PA-C       Patient nontoxic-appearing and with benign abdomen.  Patient presenting withLower abdominal cramping at the onset of her menses.  Patient reporting that she has had severe episodes of  premenstrual pain before that she has had to seek emergency care before.  She denies any vaginal discharge or pelvic pain prior to the few days before menses.  Differential diagnosis also includes PID, cystitis, appendicitis.  Low suspicion for intra-abdominal causes, as patient has no focal right lower left lower quadrant tenderness.  Cannabis hyperemesis syndrome also considered, but patient reports that she is not had any cannabis in 2 weeks.  Work-up demonstrating urinalysis with many bacteria.  This is contaminated with squamous cells and there are small leukocytes and no nitrites, and this was an otherwise ideal sample due to the small amount of urine produced.  Wet prep demonstrating many WBCs but no other abnormalities.  Clinically, patient does not have an examination consistent with PID and there is minimal vaginal discharge on exam.  GC/CT pending. She is not pregnant.  Patient clearly relates this episode to the onset of her menses and she has had this syndrome previously.  Will place patient on naproxen and Zofran and encourage patient to take before the onset of her next menses to prevent premenstrual syndrome.  Patient instructed to establish care with the women[facility] clinic to have regular gynecologic care.  Return precautions given for any worsening pain or inability to tolerate p.o.  Patient is in understanding and agrees with the plan of care.  Final Clinical Impressions(s) / ED Diagnoses   Final diagnoses:  Pelvic pain  Menstrual pain    ED Discharge Orders         Ordered    naproxen (NAPROSYN) 500 MG tablet  2 times daily     11/23/18 0046    ondansetron (ZOFRAN ODT) 4 MG disintegrating tablet  Every 8 hours PRN     11/23/18 0046           Albesa Seen, PA-C 11/23/18 0108    Lita Mains,  Shanon Brow, MD 11/23/18 [phone]

## 2018-11-23 NOTE — ED Notes (Signed)
In the room with PA , pt moaning in pain and states she is still nauseated.

## 2018-11-23 NOTE — Discharge Instructions (Signed)
Please see the information and instructions below regarding your visit.  Your diagnoses today include:  1. Pelvic pain   2. Menstrual pain     Tests performed today include: See side panel of your discharge paperwork for testing performed today. Vital signs are listed at the bottom of these instructions.   We tested for gonorrhea and chlamydia  These results will be available in 48 to 72 hours.  You can find them on your MyChart, or you will receive a call for any positive results.  You will not receive a call for any negative results.   Medications prescribed:    Take any prescribed medications only as prescribed, and any over the counter medications only as directed on the packaging.  You are prescribed naproxen, a non-steroidal anti-inflammatory agent (NSAID) for pain. You may take 500 mg every 12 hours as needed for pain. If still requiring this medication around the clock for acute pain after 10 days, please see your primary healthcare provider.  Women who are pregnant, breastfeeding, or planning on becoming pregnant should not take non-steroidal anti-inflammatories such as Advil and Aleve. Tylenol is a safe over the counter pain reliever in pregnant women.  You may combine this medication with Tylenol, 650 mg every 6 hours, so you are receiving something for pain every 3 hours.  This is not a long-term medication unless under the care and direction of your primary provider. Taking this medication long-term and not under the supervision of a healthcare provider could increase the risk of stomach ulcers, kidney problems, and cardiovascular problems such as high blood pressure.   You may take Zofran under the tongue every 8 hours as needed for nausea and vomiting.  Home care instructions:  Please follow any educational materials contained in this packet.   Follow-up instructions: [facility] as possible for further evaluation of your  menstrual problems.  Return instructions:  Please return to the Emergency Department if you experience worsening symptoms.  Please return the emergency department if you develop any worsening pain or inability to tolerate food and drink by mouth. Please return if you have any other emergent concerns.  Additional Information:   Your vital signs today were: BP 112/89 (BP Location: Right Arm)    Pulse 68    Temp 98.6 F (37 C) (Oral)    Resp 16    LMP 11/22/2018    SpO2 100%  If your blood pressure (BP) was elevated on multiple readings during this visit above 130 for the top number or above 80 for the bottom number, please have this repeated by your primary care provider within one month. --------------  Thank you for allowing [place] to participate in your care today.

## 2018-11-23 NOTE — ED Notes (Signed)
Pt with large emesis.

## 2018-11-24 LAB — GC/CHLAMYDIA PROBE AMP (~~LOC~~) NOT AT ARMC
Chlamydia: NEGATIVE
NEISSERIA GONORRHEA: NEGATIVE

## 2019-01-16 IMAGING — US US PELV - US TRANSVAGINAL
1 series · 15 of 25 positions shown · non-contrast
Comparison: None

CLINICAL DATA: Acute pelvic pain in a female

EXAM:
TRANSABDOMINAL AND TRANSVAGINAL ULTRASOUND OF PELVIS
TECHNIQUE: Both transabdominal and transvaginal ultrasound examinations of the
pelvis were performed. Transabdominal technique was performed for
global imaging of the pelvis including uterus, ovaries, adnexal
regions, and pelvic cul-de-sac. It was necessary to proceed with
endovaginal exam following the transabdominal exam to visualize the
uterus and endometrium.

[Series 1: us pelv - us transvaginal · 15 of 113 slices shown]
[im 1/113]
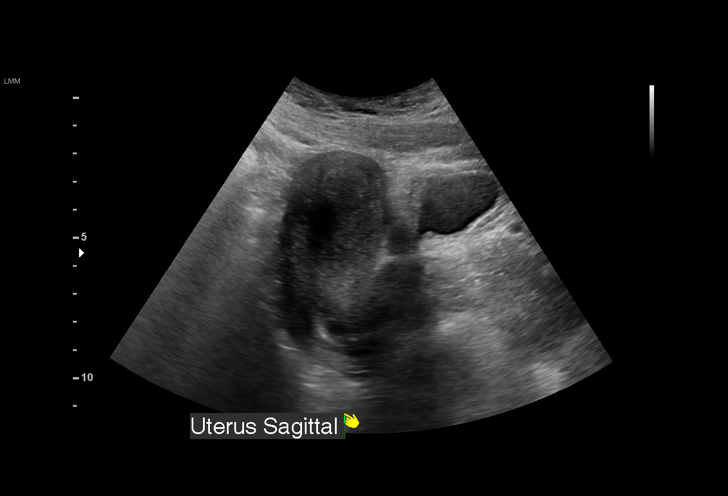
[im 10/113]
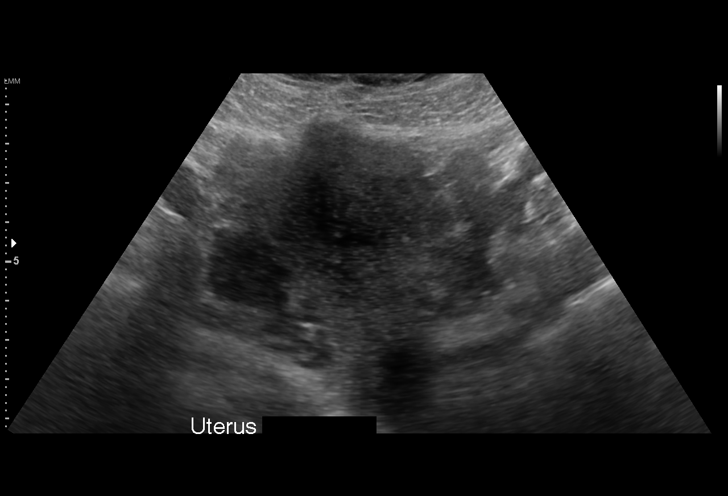
[im 19/113]
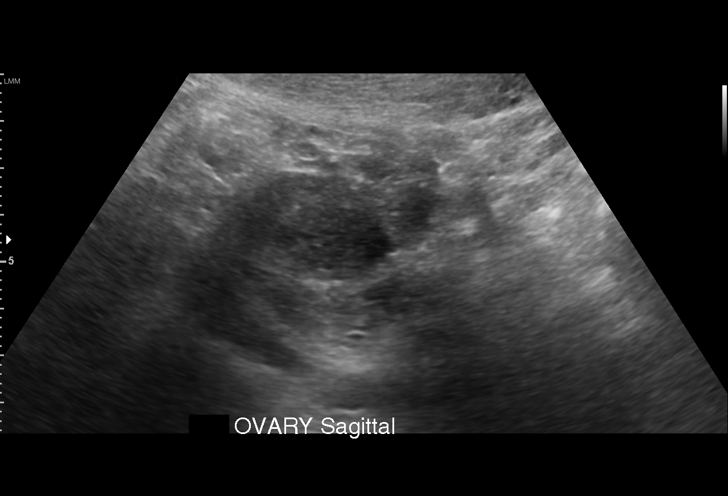
[im 24/113]
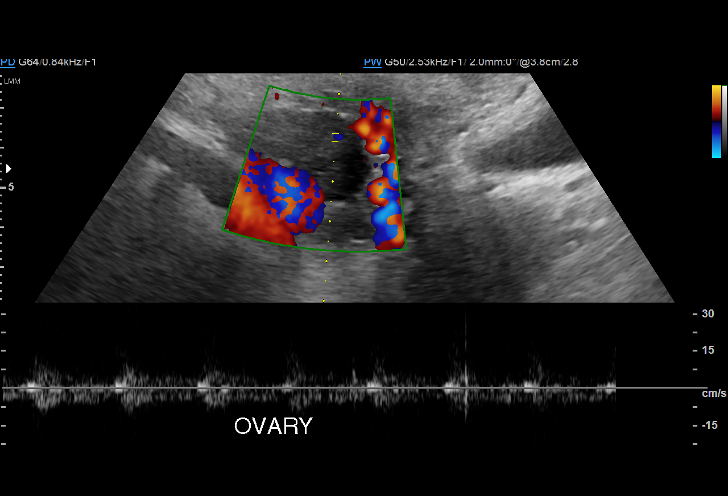
[im 33/113]
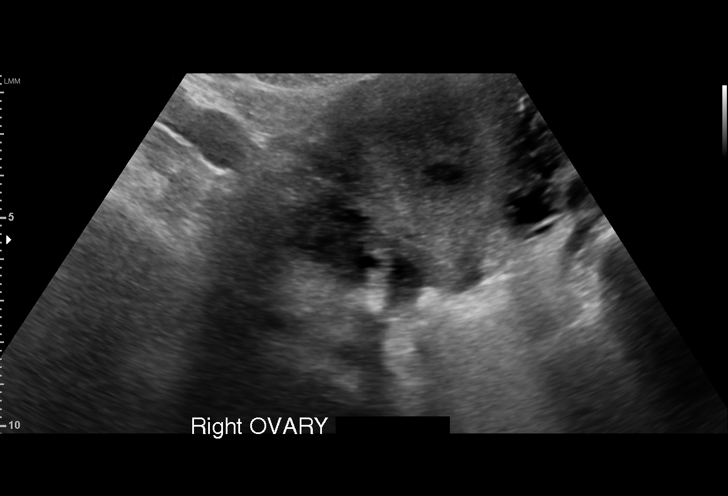
[im 43/113]
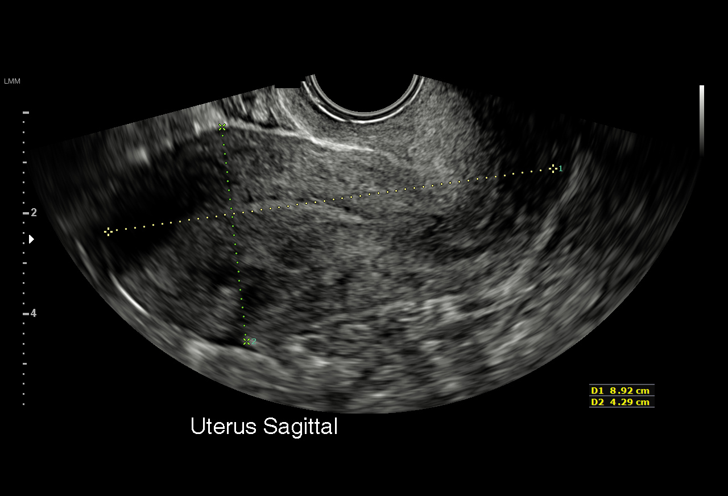
[im 47/113]
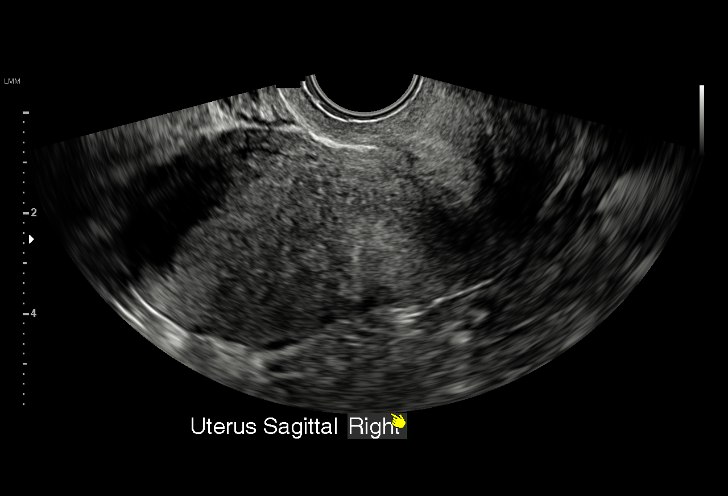
[im 57/113]
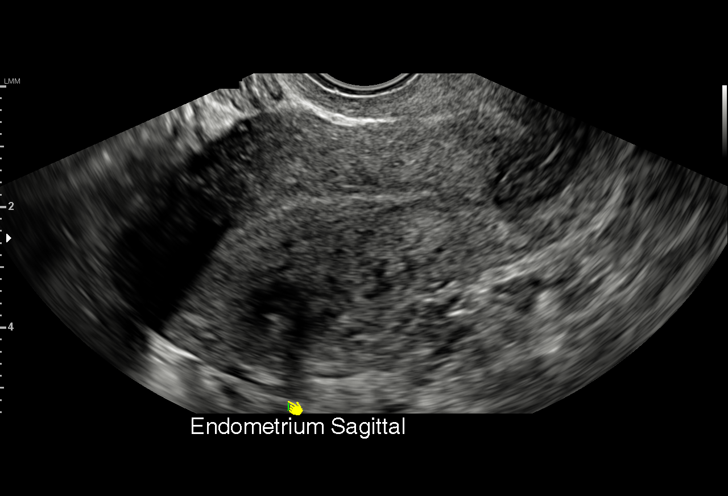
[im 66/113]
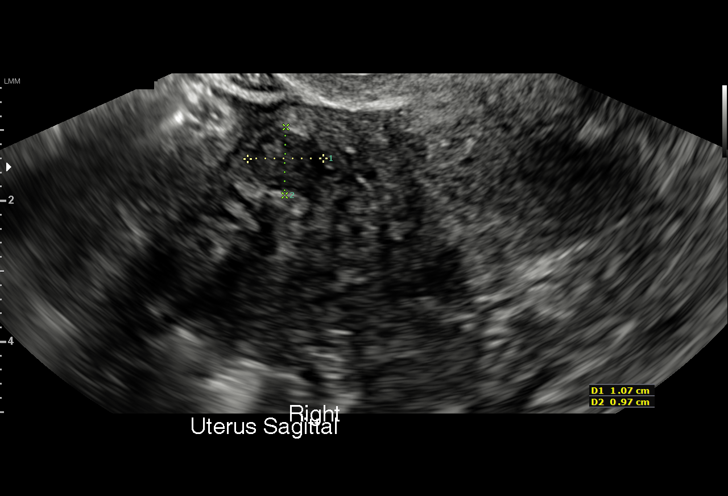
[im 71/113]
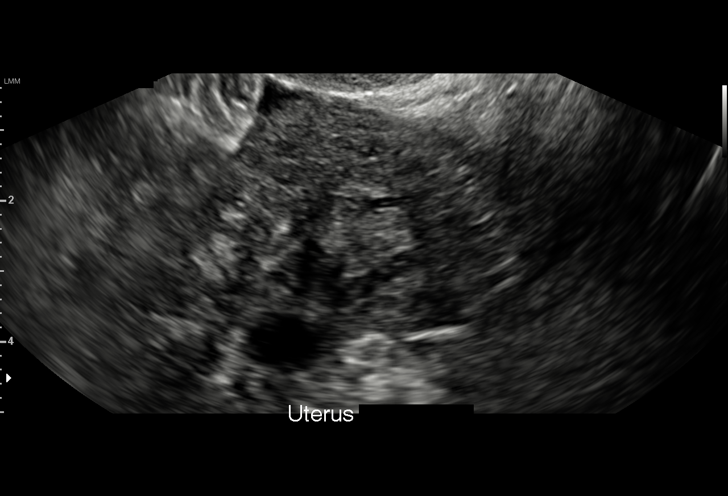
[im 80/113]
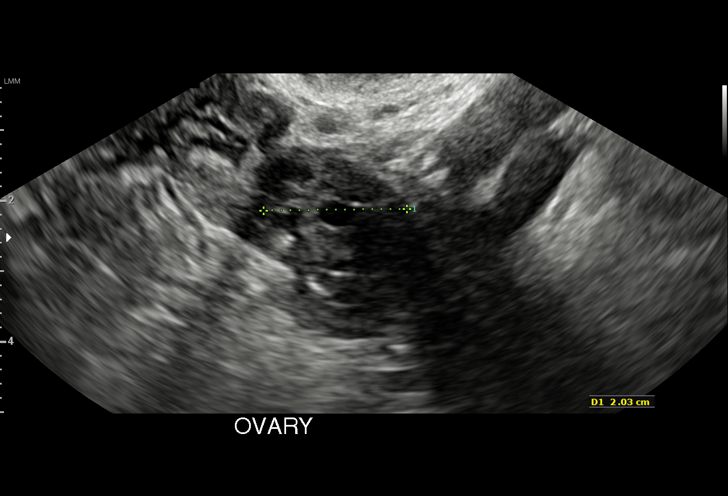
[im 89/113]
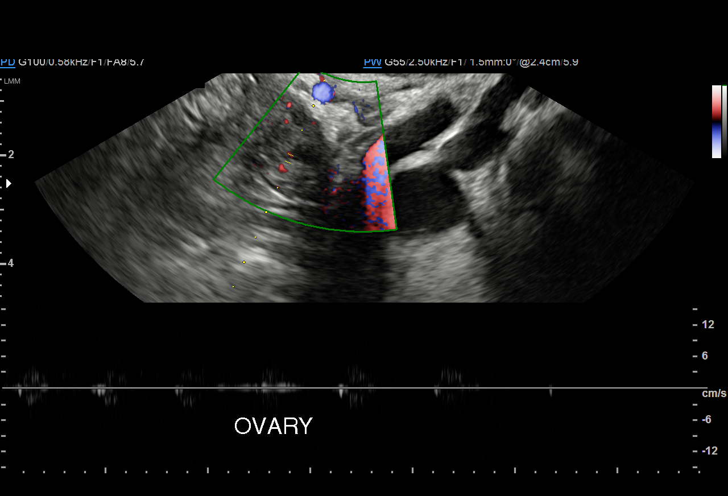
[im 94/113]
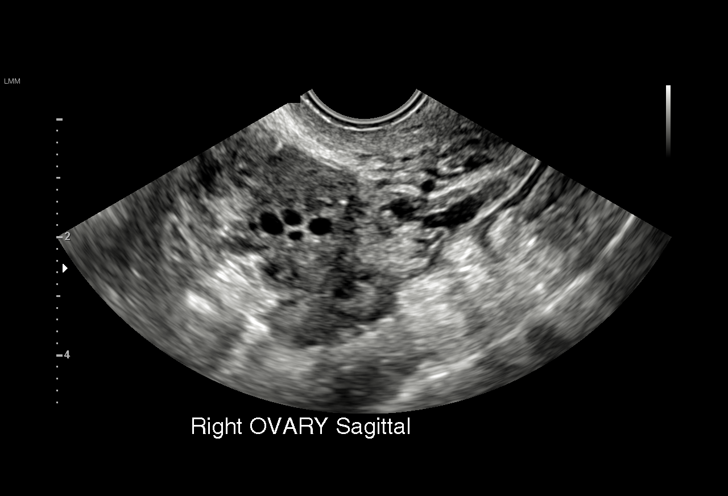
[im 103/113]
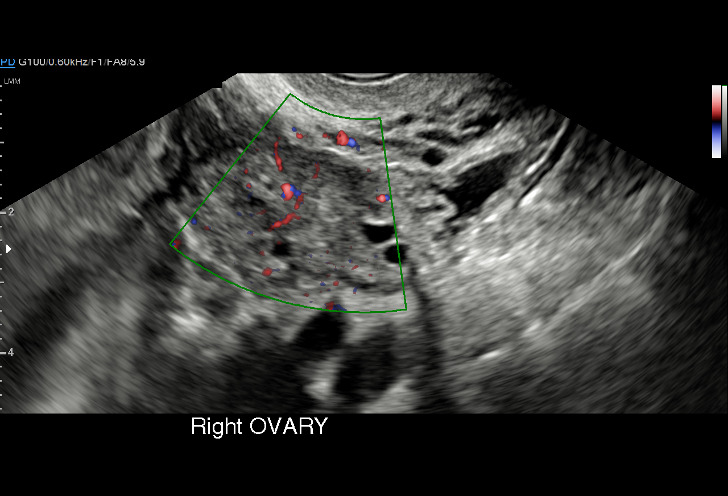
[im 113/113]
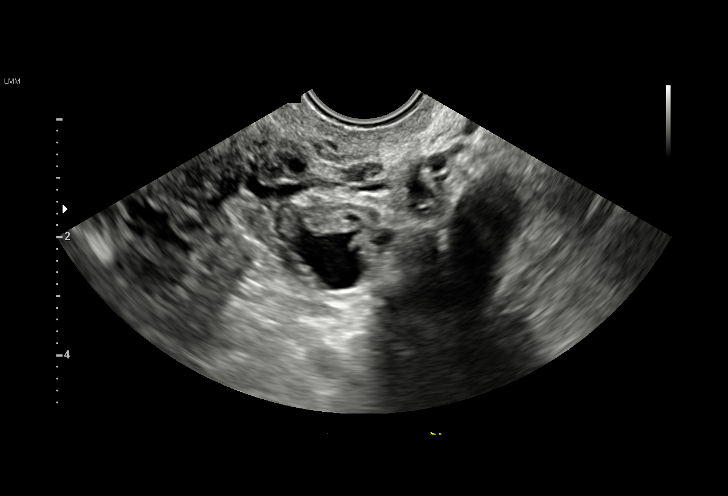

[15 of 25 positions shown; findings below may reference images not displayed]

FINDINGS: Uterus

Measurements: 8.9 x 4.3 x 5.0 cm. Three small uterine leiomyomata
are identified. Small posterior upper uterine leiomyoma 16 x 11 x 11
mm, submucosal. Small subserosal fundal leiomyoma 13 x 9 x 11 mm.
Additional anterior leiomyoma, intramural, 10 x 11 x 9 mm.

Endometrium

Thickness: 5 mm thick.  No endometrial fluid or focal abnormality

Right ovary

Measurements: 3.3 x 2.2 x 2.7 cm. Normal morphology without mass.
Internal blood flow present on color Doppler imaging.

Left ovary

Measurements: 2.5 x 1.9 x 2.0 cm. Normal morphology without mass.
Internal blood flow present on color Doppler imaging.

Other findings

Trace free pelvic fluid.  No adnexal masses.
IMPRESSION: 3 small uterine leiomyomata identified, largest of which 16 mm
diameter at the posterior upper uterus extend submucosal.

Unremarkable ovaries.

## 2019-04-16 ENCOUNTER — Emergency Department (HOSPITAL_COMMUNITY)
Admission: EM | Admit: 2019-04-16 | Discharge: 2019-04-16 | Disposition: A | Discharge status: Home / Self Care | Payer: Self-pay | Attending: Emergency Medicine | Admitting: Emergency Medicine

## 2019-04-16 ENCOUNTER — Encounter (HOSPITAL_COMMUNITY): Payer: Self-pay

## 2019-04-16 ENCOUNTER — Other Ambulatory Visit: Payer: Self-pay

## 2019-04-16 DIAGNOSIS — R102 Pelvic and perineal pain: Secondary | ICD-10-CM | POA: Insufficient documentation

## 2019-04-16 DIAGNOSIS — Z5321 Procedure and treatment not carried out due to patient leaving prior to being seen by health care provider: Secondary | ICD-10-CM | POA: Insufficient documentation

## 2019-04-16 DIAGNOSIS — N939 Abnormal uterine and vaginal bleeding, unspecified: Secondary | ICD-10-CM | POA: Insufficient documentation

## 2019-04-16 NOTE — ED Triage Notes (Signed)
Pt reports severe pelvic pain. States that she has experienced this before with her period. Endorses normal bleeding with her cycle. Pt tearful and screaming in the lobby.

## 2023-05-14 ENCOUNTER — Encounter (HOSPITAL_BASED_OUTPATIENT_CLINIC_OR_DEPARTMENT_OTHER): Payer: Self-pay | Admitting: Emergency Medicine

## 2023-05-14 ENCOUNTER — Other Ambulatory Visit: Payer: Self-pay

## 2023-05-14 ENCOUNTER — Emergency Department (HOSPITAL_BASED_OUTPATIENT_CLINIC_OR_DEPARTMENT_OTHER)
Admission: EM | Admit: 2023-05-14 | Discharge: 2023-05-14 | Disposition: A | Discharge status: Home / Self Care | Payer: Medicaid Other | Attending: Emergency Medicine | Admitting: Emergency Medicine

## 2023-05-14 DIAGNOSIS — K047 Periapical abscess without sinus: Secondary | ICD-10-CM | POA: Diagnosis not present

## 2023-05-14 DIAGNOSIS — K0889 Other specified disorders of teeth and supporting structures: Secondary | ICD-10-CM | POA: Diagnosis present

## 2023-05-14 MED ORDER — CLINDAMYCIN HCL 300 MG PO CAPS: 300.0000 mg | ORAL_CAPSULE | Freq: Four times a day (QID) | ORAL | 0 refills | Status: DC

## 2023-05-14 MED ORDER — KETOROLAC TROMETHAMINE 60 MG/2ML IM SOLN
30.0000 mg | Freq: Once | INTRAMUSCULAR | Status: AC
  Administered 2023-05-14: 30 mg via INTRAMUSCULAR
  Filled 2023-05-14: qty 2

## 2023-05-14 MED ORDER — OXYCODONE-ACETAMINOPHEN 5-325 MG PO TABS
2.0000 | ORAL_TABLET | Freq: Once | ORAL | Status: AC
  Administered 2023-05-14: 2 via ORAL
  Filled 2023-05-14: qty 2

## 2023-05-14 MED ORDER — CLINDAMYCIN HCL 150 MG PO CAPS
300.0000 mg | ORAL_CAPSULE | Freq: Once | ORAL | Status: AC
  Administered 2023-05-14: 300 mg via ORAL
  Filled 2023-05-14: qty 2

## 2023-05-14 MED ORDER — MELOXICAM 15 MG PO TABS: 15.0000 mg | ORAL_TABLET | Freq: Every day | ORAL | 0 refills | Status: AC
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
## 2023-06-24 ENCOUNTER — Ambulatory Visit: Payer: Self-pay

## 2023-06-24 NOTE — Telephone Encounter (Signed)
  Chief Complaint: tooth pain  Symptoms: top left wisdom tooth pain, crying, pain 10+/10, swelling and warmth  Frequency: couple of days just getting worse  Pertinent Negatives: NA Disposition: [x] ED /[] Urgent Care (no appt availability in office) / [] Appointment(In office/virtual)/ []  [facility]/ [] Home Care/ [] Refused Recommended Disposition /[] [facility]/ []  Follow-up with PCP Additional Notes: pt states she is trying to get help since she doesn't get paid until Friday and will have tooth pulled. Pt is asking for abx because she feels it is abscessed. She has been trying Tylenol and Ice and nothing is helping. Advised pt of going to [facility] ED and they will see her. Pt verbalized understanding.    Reason for Disposition  [1] SEVERE pain (e.g., excruciating, unable to eat, unable to do any normal activities) AND [2] not improved 2 hours after pain medicine  Answer Assessment - Initial Assessment Questions 1. LOCATION: "Which tooth is hurting?"  (e.g., right-side/left-side, upper/lower, front/back)     Top left wisdom teeth  2. ONSET: "When did the toothache start?"  (e.g., hours, days)      Couple of days  3. SEVERITY: "How bad is the toothache?"  (Scale 1-10; mild, moderate or severe)   - MILD (1-3): doesn't interfere with chewing    - MODERATE (4-7): interferes with chewing, interferes with normal activities, awakens from sleep     - SEVERE (8-10): unable to eat, unable to do any normal activities, excruciating pain        Severe, crying in pain  4. SWELLING: "Is there any visible swelling of your face?"     yes 5. OTHER SYMPTOMS: "Do you have any other symptoms?" (e.g., fever)     Warm around the tooth  Protocols used: Toothache-A-AH

## 2023-07-02 ENCOUNTER — Other Ambulatory Visit: Payer: Self-pay

## 2023-07-02 ENCOUNTER — Emergency Department (HOSPITAL_BASED_OUTPATIENT_CLINIC_OR_DEPARTMENT_OTHER)
Admission: EM | Admit: 2023-07-02 | Discharge: 2023-07-02 | Disposition: A | Discharge status: Home / Self Care | Payer: Medicaid Other | Attending: Emergency Medicine | Admitting: Emergency Medicine

## 2023-07-02 ENCOUNTER — Encounter (HOSPITAL_BASED_OUTPATIENT_CLINIC_OR_DEPARTMENT_OTHER): Payer: Self-pay

## 2023-07-02 DIAGNOSIS — K0889 Other specified disorders of teeth and supporting structures: Secondary | ICD-10-CM | POA: Diagnosis present

## 2023-07-02 MED ORDER — CLINDAMYCIN HCL 300 MG PO CAPS: 300.0000 mg | ORAL_CAPSULE | Freq: Four times a day (QID) | ORAL | 0 refills | Status: DC

## 2023-07-02 MED ORDER — OXYCODONE-ACETAMINOPHEN 5-325 MG PO TABS
1.0000 | ORAL_TABLET | Freq: Once | ORAL | Status: AC
  Administered 2023-07-02: 1 via ORAL
  Filled 2023-07-02: qty 1

## 2023-07-02 MED ORDER — KETOROLAC TROMETHAMINE 15 MG/ML IJ SOLN
15.0000 mg | Freq: Once | INTRAMUSCULAR | Status: AC
  Administered 2023-07-02: 15 mg via INTRAMUSCULAR
  Filled 2023-07-02: qty 1

## 2023-07-02 NOTE — ED Provider Notes (Signed)
[place] EMERGENCY DEPARTMENT AT [facility] Provider Note   CSN: [id number] Arrival date & time: 07/02/23  1601     History  Chief Complaint  Patient presents with   Dental Pain    Casey Sharp is a 32 y.o. female.   Dental Pain 32 year old female presenting for left upper dental pain.  She states she was seen here about a month ago for pain on the right side.  She saw a dentist and had her tooth pulled there, she was on clindamycin at the time.  However she could not afford to get this tooth pulled so now she is having pain.  She has a dental appointment on Saturday but is not able to bear the pain.  No facial swelling or trismus.  No difficulty swallowing or eating.     Home Medications Prior to Admission medications   Medication Sig Start Date End Date Taking? Authorizing Provider  clindamycin (CLEOCIN) 300 MG capsule Take 1 capsule (300 mg total) by mouth 4 (four) times daily. X 7 days 07/02/23   Laurence Spates, MD  ibuprofen (ADVIL,MOTRIN) 600 MG tablet Take 1 tablet (600 mg total) by mouth every 6 (six) hours as needed. Patient not taking: Reported on 04/27/2018 02/11/18   Hurshel Party, CNM  meloxicam (MOBIC) 15 MG tablet Take 1 tablet (15 mg total) by mouth daily. 05/14/23   Mesner, Barbara Cower, MD  naproxen (NAPROSYN) 500 MG tablet Take 1 tablet (500 mg total) by mouth 2 (two) times daily. Take 1 tablet twice a day in the 3 days leading up to your menses. 11/23/18   Aviva Kluver B, PA-C  norgestimate-ethinyl estradiol (ORTHO-CYCLEN,SPRINTEC,PREVIFEM) 0.25-35 MG-MCG tablet Take 1 tablet by mouth daily. 02/11/18   Leftwich-Kirby, Wilmer Floor, CNM  ondansetron (ZOFRAN ODT) 4 MG disintegrating tablet Take 1 tablet (4 mg total) by mouth every 8 (eight) hours as needed for nausea or vomiting. 11/23/18   Aviva Kluver B, PA-C      Allergies    Penicillins    Review of Systems   Review of Systems Review of systems completed and notable as per HPI.  ROS  otherwise negative.   Physical Exam Updated Vital Signs BP (!) 118/95 (BP Location: Right Arm)   Pulse 67   Temp 98.2 F (36.8 C) (Oral)   Resp 18   Ht 5\' 5"  (1.651 m)   Wt 74.8 kg   LMP 06/24/2023   SpO2 100%   BMI 27.46 kg/m  Physical Exam Vitals and nursing note reviewed.  Constitutional:      General: She is not in acute distress.    Appearance: She is well-developed.  HENT:     Head: Normocephalic and atraumatic.     Mouth/Throat:     Mouth: Mucous membranes are moist.     Pharynx: Oropharynx is clear.     Comments: Poor dentition.  Multiple cavities.  Her pain is over her left maxillary first molar but no pain with palpation or surrounding swelling or abscess.  No sublingual swelling.  No trismus, no submandibular swelling either.  She has no swelling over her face. Eyes:     Extraocular Movements: Extraocular movements intact.     Conjunctiva/sclera: Conjunctivae normal.     Pupils: Pupils are equal, round, and reactive to light.  Cardiovascular:     Rate and Rhythm: Normal rate and regular rhythm.     Pulses: Normal pulses.     Heart sounds: Normal heart sounds. No murmur heard. Pulmonary:  Effort: Pulmonary effort is normal. No respiratory distress.     Breath sounds: Normal breath sounds.  Abdominal:     Palpations: Abdomen is soft.     Tenderness: There is no abdominal tenderness.  Musculoskeletal:        General: No swelling.     Cervical back: Neck supple.     Right lower leg: No edema.     Left lower leg: No edema.  Skin:    General: Skin is warm and dry.     Capillary Refill: Capillary refill takes less than 2 seconds.  Neurological:     Mental Status: She is alert.  Psychiatric:        Mood and Affect: Mood normal.     ED Results / Procedures / Treatments   Labs (all labs ordered are listed, but only abnormal results are displayed) Labs Reviewed - No data to display  EKG None  Radiology No results found.  Procedures Procedures     Medications Ordered in ED Medications  oxyCODONE-acetaminophen (PERCOCET/ROXICET) 5-325 MG per tablet 1 tablet (has no administration in time range)  ketorolac (TORADOL) 15 MG/ML injection 15 mg (has no administration in time range)    ED Course/ Medical Decision Making/ A&P                                 Medical Decision Making  Medical Decision Making:   Casey Sharp is a 32 y.o. female who presented to the ED today with left upper molar pain.  All signs reviewed.  On exam she has no signs of clear infection include no signs of abscess, Ludwig's angina or other deep space infection.  No trismus.  She does have significant pain and multiple caries, will give course of clindamycin for possible reversible pulpitis.  She already has dental follow-up on Saturday.  Recommend Tylenol, Motrin in the meantime.  Return precautions given.  Reviewed and confirmed nursing documentation for past medical history, family history, social history.  Patient's presentation is most consistent with acute, uncomplicated illness.           Final Clinical Impression(s) / ED Diagnoses Final diagnoses:  Pain, dental    Rx / DC Orders ED Discharge Orders          Ordered    clindamycin (CLEOCIN) 300 MG capsule  4 times daily        07/02/23 0741              Laurence Spates, MD 07/02/23 [phone]

## 2023-07-02 NOTE — ED Triage Notes (Signed)
Pt complaining of pain in the upper left side tooth. Started a couple of days ago and is getting worse.

## 2023-07-02 NOTE — Discharge Instructions (Signed)
Your pain is likely due to your cavity in your tooth.  You may have an early dental infection so you are being started on antibiotics.  You should follow-up with your dentist on Saturday for definitive treatment.  If you develop worsening pain, difficulty opening her mouth, difficulty breathing or severe swelling you should return to the ED.

## 2023-08-19 ENCOUNTER — Other Ambulatory Visit: Payer: Self-pay

## 2023-08-19 ENCOUNTER — Emergency Department (HOSPITAL_BASED_OUTPATIENT_CLINIC_OR_DEPARTMENT_OTHER)
Admission: EM | Admit: 2023-08-19 | Discharge: 2023-08-19 | Disposition: A | Discharge status: Home / Self Care | Payer: Medicaid Other | Attending: Emergency Medicine | Admitting: Emergency Medicine

## 2023-08-19 ENCOUNTER — Emergency Department (HOSPITAL_BASED_OUTPATIENT_CLINIC_OR_DEPARTMENT_OTHER): Payer: Medicaid Other | Admitting: Radiology

## 2023-08-19 DIAGNOSIS — M79671 Pain in right foot: Secondary | ICD-10-CM | POA: Diagnosis present

## 2023-08-19 DIAGNOSIS — S92301A Fracture of unspecified metatarsal bone(s), right foot, initial encounter for closed fracture: Secondary | ICD-10-CM | POA: Insufficient documentation

## 2023-08-19 DIAGNOSIS — X58XXXA Exposure to other specified factors, initial encounter: Secondary | ICD-10-CM | POA: Diagnosis not present

## 2023-08-19 MED ORDER — KETOROLAC TROMETHAMINE 60 MG/2ML IM SOLN
30.0000 mg | Freq: Once | INTRAMUSCULAR | Status: AC
  Administered 2023-08-19: 30 mg via INTRAMUSCULAR
  Filled 2023-08-19: qty 2

## 2023-08-19 NOTE — ED Triage Notes (Signed)
Right ankle and foot pain. Swelling to dorsal aspect of right foot and redness in pinky toes. Reports wearing heels this weekend. Had been drinking and is unsure if she rolled ankle. Cannot walk on this foot today.

## 2023-08-19 NOTE — ED Provider Notes (Signed)
[place] EMERGENCY DEPARTMENT AT [facility] Provider Note   CSN: [id number] Arrival date & time: 08/19/23  1516     History  No chief complaint on file.   Casey Sharp is a 32 y.o. female.  This is a 32 year old female here today with plantar pain.  Patient states that she was wearing heels on Sunday night, was out at restaurants and bars, and she woke in the morning she had pain in her foot.        Home Medications Prior to Admission medications   Medication Sig Start Date End Date Taking? Authorizing Provider  clindamycin (CLEOCIN) 300 MG capsule Take 1 capsule (300 mg total) by mouth 4 (four) times daily. X 7 days 07/02/23   Laurence Spates, MD  ibuprofen (ADVIL,MOTRIN) 600 MG tablet Take 1 tablet (600 mg total) by mouth every 6 (six) hours as needed. Patient not taking: Reported on 04/27/2018 02/11/18   Hurshel Party, CNM  meloxicam (MOBIC) 15 MG tablet Take 1 tablet (15 mg total) by mouth daily. 05/14/23   Mesner, Barbara Cower, MD  naproxen (NAPROSYN) 500 MG tablet Take 1 tablet (500 mg total) by mouth 2 (two) times daily. Take 1 tablet twice a day in the 3 days leading up to your menses. 11/23/18   Aviva Kluver B, PA-C  norgestimate-ethinyl estradiol (ORTHO-CYCLEN,SPRINTEC,PREVIFEM) 0.25-35 MG-MCG tablet Take 1 tablet by mouth daily. 02/11/18   Leftwich-Kirby, Wilmer Floor, CNM  ondansetron (ZOFRAN ODT) 4 MG disintegrating tablet Take 1 tablet (4 mg total) by mouth every 8 (eight) hours as needed for nausea or vomiting. 11/23/18   Aviva Kluver B, PA-C      Allergies    Penicillins    Review of Systems   Review of Systems  Physical Exam Updated Vital Signs BP 90/78 (BP Location: Right Arm)   Pulse 88   Temp 98.3 F (36.8 C) (Oral)   Resp 18   SpO2 100%  Physical Exam Vitals reviewed.  Musculoskeletal:        General: Normal range of motion.     Comments: No tenderness in the lateral or medial malleolus.  No swelling in the lateral or medial  malleolus.  Patient with pain in the plantar fascia.  Skin:    General: Skin is warm and dry.  Neurological:     Mental Status: She is alert.     ED Results / Procedures / Treatments   Labs (all labs ordered are listed, but only abnormal results are displayed) Labs Reviewed - No data to display  EKG None  Radiology DG Ankle 2 Views Right Result Date: 08/19/2023 CLINICAL DATA:  Right foot and ankle pain with swelling to the dorsal right foot and redness in the pinky toe EXAM: RIGHT ANKLE - 2 VIEW; RIGHT FOOT - 2 VIEW COMPARISON:  None Available. FINDINGS: Triangular ossific density is seen along the superior proximal aspect of the navicular, where there is increased sclerosis and questionable lucency. There is mild hallux valgus. Soft tissues are unremarkable. IMPRESSION: Triangular ossific density along the superior proximal aspect of the navicular, where there is increased sclerosis and questionable lucency. Findings may represent an avulsion fracture on a background of stress-related changes. Recommend correlation with point tenderness. Electronically Signed   By: Agustin Cree M.D.   On: 08/19/2023 17:07   DG Foot 2 Views Right Result Date: 08/19/2023 CLINICAL DATA:  Right foot and ankle pain with swelling to the dorsal right foot and redness in the pinky toe EXAM: RIGHT  ANKLE - 2 VIEW; RIGHT FOOT - 2 VIEW COMPARISON:  None Available. FINDINGS: Triangular ossific density is seen along the superior proximal aspect of the navicular, where there is increased sclerosis and questionable lucency. There is mild hallux valgus. Soft tissues are unremarkable. IMPRESSION: Triangular ossific density along the superior proximal aspect of the navicular, where there is increased sclerosis and questionable lucency. Findings may represent an avulsion fracture on a background of stress-related changes. Recommend correlation with point tenderness. Electronically Signed   By: Agustin Cree M.D.   On: 08/19/2023 17:07     Procedures Procedures    Medications Ordered in ED Medications  ketorolac (TORADOL) injection 30 mg (has no administration in time range)    ED Course/ Medical Decision Making/ A&P                                 Medical Decision Making 32 year old female here today with foot and ankle pain.  Differential diagnoses include plantar fasciitis, ankle sprain, possible foot fracture, less likely ankle fracture.  Plan- patient's exam is most consistent with plantar fasciitis, or plantar tendon injury.  Lower suspicion for stress fracture given her reported history.  Have ordered plain films of the patient's foot and ankle.  No gross deformity on exam.  Reassessment 5:15 PM-patient's x-ray does show possible avulsion fraction at point of tenderness.  Will place patient in a boot, provide crutches.  Will provide orthopedic follow-up.  Will discharge patient.  Amount and/or Complexity of Data Reviewed Radiology: ordered.           Final Clinical Impression(s) / ED Diagnoses Final diagnoses:  Closed avulsion fracture of metatarsal bone of right foot, initial encounter    Rx / DC Orders ED Discharge Orders     None         Anders Simmonds T, DO 08/19/23 1719

## 2023-08-19 NOTE — Discharge Instructions (Addendum)
You likely do have a broken bone at the place in your foot where you are hurting.  This is called your navicular bone.  You can wear boot and use crutches for the next week until you are able to be seen by an orthopedic doctor. You can take 1000 mg of Tylenol every 8 hours, 400 mg of Motrin every 6 hours.

## 2023-12-09 ENCOUNTER — Other Ambulatory Visit: Payer: Self-pay

## 2023-12-09 ENCOUNTER — Emergency Department (HOSPITAL_BASED_OUTPATIENT_CLINIC_OR_DEPARTMENT_OTHER)
Admission: EM | Admit: 2023-12-09 | Discharge: 2023-12-09 | Disposition: A | Discharge status: Home / Self Care | Attending: Emergency Medicine | Admitting: Emergency Medicine

## 2023-12-09 ENCOUNTER — Encounter (HOSPITAL_BASED_OUTPATIENT_CLINIC_OR_DEPARTMENT_OTHER): Payer: Self-pay

## 2023-12-09 DIAGNOSIS — K0889 Other specified disorders of teeth and supporting structures: Secondary | ICD-10-CM | POA: Insufficient documentation

## 2023-12-09 MED ORDER — CLINDAMYCIN HCL 150 MG PO CAPS
300.0000 mg | ORAL_CAPSULE | Freq: Once | ORAL | Status: AC
  Administered 2023-12-09: 300 mg via ORAL
  Filled 2023-12-09: qty 2

## 2023-12-09 MED ORDER — HYDROCODONE-ACETAMINOPHEN 5-325 MG PO TABS: 1.0000 | ORAL_TABLET | Freq: Four times a day (QID) | ORAL | 0 refills | Status: AC | PRN

## 2023-12-09 MED ORDER — CLINDAMYCIN HCL 300 MG PO CAPS: 300.0000 mg | ORAL_CAPSULE | Freq: Four times a day (QID) | ORAL | 0 refills | Status: DC

## 2023-12-09 MED ORDER — HYDROCODONE-ACETAMINOPHEN 5-325 MG PO TABS
2.0000 | ORAL_TABLET | Freq: Once | ORAL | Status: AC
  Administered 2023-12-09: 2 via ORAL
  Filled 2023-12-09: qty 2

## 2023-12-09 NOTE — Discharge Instructions (Signed)
 Begin taking clindamycin as prescribed.  Begin taking ibuprofen 600 mg every 6 hours as needed for pain.  Begin taking hydrocodone as prescribed as needed for pain not relieved with ibuprofen.  Follow-up with dentistry in the next few days.

## 2023-12-09 NOTE — ED Triage Notes (Signed)
 Pt reports pain and swelling of left lower teeth x 2 days. . Pt thinks that she has developed an abscess in the area, as well.

## 2023-12-09 NOTE — ED Provider Notes (Signed)
 [place] EMERGENCY DEPARTMENT AT [facility] Provider Note   CSN: [id number] Arrival date & time: 12/09/23  0320     History  Chief Complaint  Patient presents with   Dental Pain    Casey Sharp is a 33 y.o. female.  Patient is a 33 year old female presenting with complaints of dental pain and facial swelling.  She has a history of dental caries with a missing upper and lower molar.  She started with inflammation around the missing tooth site and is now having some swelling to her cheek.  No fevers or chills.  No difficulty breathing or swallowing.       Home Medications Prior to Admission medications   Medication Sig Start Date End Date Taking? Authorizing Provider  clindamycin (CLEOCIN) 300 MG capsule Take 1 capsule (300 mg total) by mouth 4 (four) times daily. X 7 days 07/02/23   Laurence Spates, MD  ibuprofen (ADVIL,MOTRIN) 600 MG tablet Take 1 tablet (600 mg total) by mouth every 6 (six) hours as needed. Patient not taking: Reported on 04/27/2018 02/11/18   Hurshel Party, CNM  meloxicam (MOBIC) 15 MG tablet Take 1 tablet (15 mg total) by mouth daily. 05/14/23   Mesner, Barbara Cower, MD  naproxen (NAPROSYN) 500 MG tablet Take 1 tablet (500 mg total) by mouth 2 (two) times daily. Take 1 tablet twice a day in the 3 days leading up to your menses. 11/23/18   Aviva Kluver B, PA-C  norgestimate-ethinyl estradiol (ORTHO-CYCLEN,SPRINTEC,PREVIFEM) 0.25-35 MG-MCG tablet Take 1 tablet by mouth daily. 02/11/18   Leftwich-Kirby, Wilmer Floor, CNM  ondansetron (ZOFRAN ODT) 4 MG disintegrating tablet Take 1 tablet (4 mg total) by mouth every 8 (eight) hours as needed for nausea or vomiting. 11/23/18   Aviva Kluver B, PA-C      Allergies    Penicillins    Review of Systems   Review of Systems  All other systems reviewed and are negative.   Physical Exam Updated Vital Signs BP 118/81 (BP Location: Left Arm)   Pulse 78   Temp (!) 97.5 F (36.4 C)   Resp 16   LMP  11/13/2023 (Exact Date)   SpO2 100%  Physical Exam Vitals and nursing note reviewed.  Constitutional:      Appearance: Normal appearance.  HENT:     Head: Normocephalic.     Mouth/Throat:     Comments: There is a missing upper and lower first molar.  There is some inflammation of the socket site.  There is also swelling noted to the left mandible. Pulmonary:     Effort: Pulmonary effort is normal.  Skin:    General: Skin is warm and dry.  Neurological:     Mental Status: She is alert and oriented to person, place, and time.     ED Results / Procedures / Treatments   Labs (all labs ordered are listed, but only abnormal results are displayed) Labs Reviewed - No data to display  EKG None  Radiology No results found.  Procedures Procedures    Medications Ordered in ED Medications  HYDROcodone-acetaminophen (NORCO/VICODIN) 5-325 MG per tablet 2 tablet (has no administration in time range)  clindamycin (CLEOCIN) capsule 300 mg (has no administration in time range)    ED Course/ Medical Decision Making/ A&P  Dental infection.  Will treat with clindamycin and pain medication.  Patient to follow-up with dentistry.  Final Clinical Impression(s) / ED Diagnoses Final diagnoses:  None    Rx / DC Orders ED Discharge  Orders     None         Orvilla Blander, MD 12/09/23 [phone]

## 2023-12-09 NOTE — ED Notes (Signed)
 Swelling noted to bottom, left teeth x 2 days

## 2024-02-09 ENCOUNTER — Encounter (HOSPITAL_BASED_OUTPATIENT_CLINIC_OR_DEPARTMENT_OTHER): Payer: Self-pay

## 2024-02-09 ENCOUNTER — Other Ambulatory Visit: Payer: Self-pay

## 2024-02-09 ENCOUNTER — Emergency Department (HOSPITAL_BASED_OUTPATIENT_CLINIC_OR_DEPARTMENT_OTHER)
Admission: EM | Admit: 2024-02-09 | Discharge: 2024-02-09 | Disposition: A | Discharge status: Home / Self Care | Attending: Emergency Medicine | Admitting: Emergency Medicine

## 2024-02-09 DIAGNOSIS — K047 Periapical abscess without sinus: Secondary | ICD-10-CM | POA: Diagnosis not present

## 2024-02-09 DIAGNOSIS — K0889 Other specified disorders of teeth and supporting structures: Secondary | ICD-10-CM

## 2024-02-09 MED ORDER — CELECOXIB 200 MG PO CAPS: 200.0000 mg | ORAL_CAPSULE | Freq: Two times a day (BID) | ORAL | 0 refills | Status: AC | PRN

## 2024-02-09 MED ORDER — CHLORHEXIDINE GLUCONATE 0.12 % MT SOLN: 15.0000 mL | Freq: Two times a day (BID) | OROMUCOSAL | 0 refills | Status: AC

## 2024-02-09 MED ORDER — BENZOCAINE 20 % MT AERO
INHALATION_SPRAY | Freq: Once | OROMUCOSAL | Status: AC
  Filled 2024-02-09: qty 57

## 2024-02-09 MED ORDER — CLINDAMYCIN HCL 300 MG PO CAPS: 300.0000 mg | ORAL_CAPSULE | Freq: Three times a day (TID) | ORAL | 0 refills | Status: AC

## 2024-02-09 MED ORDER — CLINDAMYCIN HCL 150 MG PO CAPS
300.0000 mg | ORAL_CAPSULE | Freq: Once | ORAL | Status: AC
  Administered 2024-02-09: 300 mg via ORAL
  Filled 2024-02-09: qty 2

## 2024-02-09 NOTE — ED Triage Notes (Signed)
 Arrives POV with complaints of worsening dental pain & oral swelling that started today. Patient reports having dental sues which she is scheduled to have a tooth removed this week. She is concerned due to swelling to mouth and pain.

## 2024-02-09 NOTE — Discharge Instructions (Signed)
 You did have a dental abscess that was drained in the emergency department.  Will put on antibiotics for treatment of infection.  Recommend chlorhexidine mouthwash or warm salt water gargles at home.  He may benefit from warm compresses on the left side of your face to help with drainage.  Keep appointment on Friday to have affected teeth addressed.  Developing if you develop any worrisome signs and symptoms discussed.

## 2024-02-09 NOTE — ED Provider Notes (Signed)
 [place] EMERGENCY DEPARTMENT AT [facility] Provider Note   CSN: [id number] Arrival date & time: 02/09/24  1715     Patient presents with: Dental Pain and Oral Swelling   Casey Sharp is a 33 y.o. female.    Dental Pain   33 year old female presents emergency department with complaints of left lower dental pain.  States that she has been having pain for the past few days.  Woke up this morning with worsening swelling prompted visit to the emergency department today.  States she has a planned procedure on Friday to have a few different teeth on the left side extracted.  Denies any tongue swelling, floor of mouth swelling, difficulty breathing/swallowing.  Does report painful chewing.  Denies any recent antibiotic use.  Past medical history significant for seasonal allergies, trichomoniasis  Prior to Admission medications   Medication Sig Start Date End Date Taking? Authorizing Provider  clindamycin  (CLEOCIN ) 300 MG capsule Take 1 capsule (300 mg total) by mouth 4 (four) times daily. X 7 days 12/09/23   Orvilla Blander, MD  HYDROcodone -acetaminophen  (NORCO/VICODIN) 5-325 MG tablet Take 1-2 tablets by mouth every 6 (six) hours as needed. 12/09/23   Orvilla Blander, MD  ibuprofen  (ADVIL ,MOTRIN ) 600 MG tablet Take 1 tablet (600 mg total) by mouth every 6 (six) hours as needed. Patient not taking: Reported on 04/27/2018 02/11/18   Asher Lawn, CNM  meloxicam  (MOBIC ) 15 MG tablet Take 1 tablet (15 mg total) by mouth daily. 05/14/23   Mesner, Reymundo Caulk, MD  naproxen  (NAPROSYN ) 500 MG tablet Take 1 tablet (500 mg total) by mouth 2 (two) times daily. Take 1 tablet twice a day in the 3 days leading up to your menses. 11/23/18   Murray, Alyssa B, PA-C  norgestimate -ethinyl estradiol  (ORTHO-CYCLEN,SPRINTEC,PREVIFEM) 0.25-35 MG-MCG tablet Take 1 tablet by mouth daily. 02/11/18   Leftwich-Kirby, Darren Em, CNM  ondansetron  (ZOFRAN  ODT) 4 MG disintegrating tablet Take 1 tablet (4 mg total)  by mouth every 8 (eight) hours as needed for nausea or vomiting. 11/23/18   Murray, Alyssa B, PA-C    Allergies: Penicillins    Review of Systems  All other systems reviewed and are negative.   Updated Vital Signs BP 117/79 (BP Location: Right Arm)   Pulse 88   Temp 98.5 F (36.9 C) (Oral)   Resp 20   Ht 5' 5 (1.651 m)   Wt 74.4 kg   SpO2 100%   BMI 27.29 kg/m   Physical Exam Vitals and nursing note reviewed.  Constitutional:      General: She is not in acute distress.    Appearance: She is well-developed.  HENT:     Head: Normocephalic and atraumatic.     Mouth/Throat:      Comments: Multiple carious teeth present.  Molar of concern as above with gingival tenderness.  Periapical abscess present.  No sublingual extremity or lip swelling.  No trismus.  No changes in phonation per patient.  Tender to palpation overlying mandible on the left side of the tooth.  Eyes:     Conjunctiva/sclera: Conjunctivae normal.    Cardiovascular:     Rate and Rhythm: Normal rate and regular rhythm.     Heart sounds: No murmur heard. Pulmonary:     Effort: Pulmonary effort is normal. No respiratory distress.     Breath sounds: Normal breath sounds.  Abdominal:     Palpations: Abdomen is soft.     Tenderness: There is no abdominal tenderness.   Musculoskeletal:  General: No swelling.     Cervical back: Neck supple.   Skin:    General: Skin is warm and dry.     Capillary Refill: Capillary refill takes less than 2 seconds.   Neurological:     Mental Status: She is alert.   Psychiatric:        Mood and Affect: Mood normal.     (all labs ordered are listed, but only abnormal results are displayed) Labs Reviewed - No data to display  EKG: None  Radiology: No results found.   .Incision and Drainage  Date/Time: 02/09/2024 5:51 PM  Performed by: [place] Butter, PA Authorized by: [place] Butter, PA   Consent:    Consent obtained:  Verbal   Consent given by:   Patient   Risks discussed:  Bleeding, incomplete drainage, pain and damage to other organs   Alternatives discussed:  No treatment Universal protocol:    Procedure explained and questions answered to patient or proxy's satisfaction: yes     Relevant documents present and verified: yes     Immediately prior to procedure, a time out was called: yes     Patient identity confirmed:  Verbally with patient Location:    Type:  Abscess   Location:  Mouth   Mouth location: Periapical abscess. Sedation:    Sedation type:  None Anesthesia:    Anesthesia method:  Topical application   Topical anesthesia: hurricaine spray. Procedure type:    Complexity:  Simple Procedure details:    Ultrasound guidance: no     Incision types:  Stab incision (18 gauge needle used to make puncture wound to the top of abscess.)   Incision depth:  Subcutaneous   Drainage:  Purulent   Drainage amount:  Moderate   Packing materials:  None Post-procedure details:    Procedure completion:  Tolerated well, no immediate complications    Medications Ordered in the ED - No data to display                                  Medical Decision Making Risk OTC drugs. Prescription drug management.   This patient presents to the ED for concern of dental pain, this involves an extensive number of treatment options, and is a complaint that carries with it a high risk of complications and morbidity.  The differential diagnosis includes periapical abscess, Ludwig angina, peritonsillar abscess, necrotizing ulcerative gingivitis, osteomyelitis, other   Co morbidities that complicate the patient evaluation  See HPI   Additional history obtained:  Additional history obtained from EMR External records from outside source obtained and reviewed including hospital records   Lab Tests:  N/a   Imaging Studies ordered:  N/a   Cardiac Monitoring: / EKG:  N/a   Consultations Obtained:  N/a   Problem List /  ED Course / Critical interventions / Medication management  Dental pain,.  Periapical abscess I ordered medication including HurriCaine spray   Reevaluation of the patient after these medicines showed that the patient improved I have reviewed the patients home medicines and have made adjustments as needed   Social Determinants of Health:  Former cigarette use.  Denies illicit drug use.   Test / Admission - Considered:  Dental pain,.  Periapical abscess Vitals signs within normal range and stable throughout visit. 33 year old female presents emergency department with complaints of left lower dental pain.  States that she has been having pain for  the past few days.  Woke up this morning with worsening swelling prompted visit to the emergency department today.  States she has a planned procedure on Friday to have a few different teeth on the left side extracted.  Denies any tongue swelling, floor of mouth swelling, difficulty breathing/swallowing.  Does report painful chewing.  Denies any recent antibiotic use. On exam, dental/gingival tenderness as above with evidence clinically of periapical abscess.  Lower suspicion for Ludwig angina based on patient's current clinical presentation.  Periapical abscess drained in manner as above.  Will place patient on empiric antibiotics and recommend adequate dental hygiene as described in AVS.  Patient has dental follow-up on Friday to have affected tooth addressed.  Strict return/ED precautions discussed at length.  Treatment plan discussed with patient and she acknowledged understanding was agreeable to said plan.  Patient will well-appearing, afebrile in no acute distress. Worrisome signs and symptoms were discussed with the patient, and the patient acknowledged understanding to return to the ED if noticed. Patient was stable upon discharge.       Final diagnoses:  None    ED Discharge Orders     None          [place] Butter,  [place] 02/09/24 1835    Tonya Fredrickson, MD 02/10/24 1050

## 2024-08-14 ENCOUNTER — Encounter (HOSPITAL_BASED_OUTPATIENT_CLINIC_OR_DEPARTMENT_OTHER): Payer: Self-pay

## 2024-08-14 ENCOUNTER — Emergency Department (HOSPITAL_BASED_OUTPATIENT_CLINIC_OR_DEPARTMENT_OTHER): Payer: Self-pay | Admitting: Radiology

## 2024-08-14 ENCOUNTER — Other Ambulatory Visit: Payer: Self-pay

## 2024-08-14 ENCOUNTER — Emergency Department (HOSPITAL_BASED_OUTPATIENT_CLINIC_OR_DEPARTMENT_OTHER): Payer: Self-pay

## 2024-08-14 ENCOUNTER — Emergency Department (HOSPITAL_BASED_OUTPATIENT_CLINIC_OR_DEPARTMENT_OTHER)
Admission: EM | Admit: 2024-08-14 | Discharge: 2024-08-14 | Disposition: A | Discharge status: Home / Self Care | Payer: Self-pay | Attending: Emergency Medicine | Admitting: Emergency Medicine

## 2024-08-14 DIAGNOSIS — M62838 Other muscle spasm: Secondary | ICD-10-CM | POA: Insufficient documentation

## 2024-08-14 DIAGNOSIS — Y92410 Unspecified street and highway as the place of occurrence of the external cause: Secondary | ICD-10-CM | POA: Insufficient documentation

## 2024-08-14 DIAGNOSIS — R519 Headache, unspecified: Secondary | ICD-10-CM | POA: Insufficient documentation

## 2024-08-14 DIAGNOSIS — S8002XA Contusion of left knee, initial encounter: Secondary | ICD-10-CM | POA: Insufficient documentation

## 2024-08-14 MED ORDER — CYCLOBENZAPRINE HCL 10 MG PO TABS: 10.0000 mg | ORAL_TABLET | Freq: Two times a day (BID) | ORAL | 0 refills | Status: AC | PRN

## 2024-08-14 MED ORDER — ACETAMINOPHEN 500 MG PO TABS
1000.0000 mg | ORAL_TABLET | Freq: Once | ORAL | Status: AC
  Administered 2024-08-14: 1000 mg via ORAL
  Filled 2024-08-14: qty 2
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
## 2024-08-14 NOTE — Discharge Instructions (Addendum)
 Recommend 1000 mg of Tylenol  every 6 hours as needed for pain.  Recommend 400 mg of ibuprofen  every 8 hours as needed for pain.  I have written you for a muscle relaxant called Flexeril  to take as needed.  This medication is sedating so please be careful with its use.  Follow-up with primary care doctor.  Return if symptoms worsen.
